# Patient Record
Sex: Male | Born: 1950 | Hispanic: No | Smoking: Never smoker
Health system: Southern US, Community
[De-identification: ages and names within clinical notes are randomized; demographics above are authoritative.]

## PROBLEM LIST (undated history)

## (undated) DIAGNOSIS — T148XXA Other injury of unspecified body region, initial encounter: Secondary | ICD-10-CM

## (undated) DIAGNOSIS — I10 Essential (primary) hypertension: Secondary | ICD-10-CM

## (undated) DIAGNOSIS — Z9049 Acquired absence of other specified parts of digestive tract: Secondary | ICD-10-CM

## (undated) HISTORY — DX: Other injury of unspecified body region, initial encounter: T14.8XXA

---

## 2000-03-12 ENCOUNTER — Ambulatory Visit (HOSPITAL_COMMUNITY): Admission: RE | Admit: 2000-03-12 | Discharge: 2000-03-12 | Payer: Self-pay | Admitting: Gastroenterology

## 2004-12-23 ENCOUNTER — Inpatient Hospital Stay (HOSPITAL_COMMUNITY): Admission: EM | Admit: 2004-12-23 | Discharge: 2004-12-27 | Payer: Self-pay | Admitting: Emergency Medicine

## 2005-04-03 ENCOUNTER — Ambulatory Visit: Payer: Self-pay | Admitting: Internal Medicine

## 2005-05-22 ENCOUNTER — Ambulatory Visit: Payer: Self-pay | Admitting: Internal Medicine

## 2005-06-06 ENCOUNTER — Ambulatory Visit: Payer: Self-pay | Admitting: Internal Medicine

## 2006-11-27 HISTORY — PX: APPENDECTOMY: SHX54

## 2011-01-30 ENCOUNTER — Encounter (INDEPENDENT_AMBULATORY_CARE_PROVIDER_SITE_OTHER): Payer: Self-pay | Admitting: *Deleted

## 2011-01-30 LAB — CONVERTED CEMR LAB
ALT: 13 units/L (ref 0–53)
AST: 15 units/L (ref 0–37)
Albumin: 4.5 g/dL (ref 3.5–5.2)
Alkaline Phosphatase: 50 units/L (ref 39–117)
BUN: 12 mg/dL (ref 6–23)
Basophils Absolute: 0 10*3/uL (ref 0.0–0.1)
Basophils Relative: 1 % (ref 0–1)
CO2: 27 meq/L (ref 19–32)
Calcium: 9.6 mg/dL (ref 8.4–10.5)
Chloride: 104 meq/L (ref 96–112)
Cholesterol: 242 mg/dL — ABNORMAL HIGH (ref 0–200)
Creatinine, Ser: 1.1 mg/dL (ref 0.40–1.50)
Eosinophils Absolute: 0.3 10*3/uL (ref 0.0–0.7)
Eosinophils Relative: 5 % (ref 0–5)
Glucose, Bld: 85 mg/dL (ref 70–99)
HCT: 45.1 % (ref 39.0–52.0)
HDL: 44 mg/dL (ref >39)
Hemoglobin: 15.8 g/dL (ref 13.0–17.0)
LDL Cholesterol: 166 mg/dL — ABNORMAL HIGH (ref 0–99)
Lymphocytes Relative: 55 % — ABNORMAL HIGH (ref 12–46)
Lymphs Abs: 3.8 10*3/uL (ref 0.7–4.0)
MCHC: 35 g/dL (ref 30.0–36.0)
MCV: 85.3 fL (ref 78.0–100.0)
Monocytes Absolute: 0.7 10*3/uL (ref 0.1–1.0)
Monocytes Relative: 10 % (ref 3–12)
Neutro Abs: 2.1 10*3/uL (ref 1.7–7.7)
Neutrophils Relative %: 30 % — ABNORMAL LOW (ref 43–77)
Platelets: 239 10*3/uL (ref 150–400)
Potassium: 4.4 meq/L (ref 3.5–5.3)
RBC: 5.29 M/uL (ref 4.22–5.81)
RDW: 13.6 % (ref 11.5–15.5)
Sodium: 142 meq/L (ref 135–145)
TSH: 3.538 microintl units/mL (ref 0.350–4.500)
Total Bilirubin: 0.6 mg/dL (ref 0.3–1.2)
Total CHOL/HDL Ratio: 5.5
Total Protein: 7.2 g/dL (ref 6.0–8.3)
Triglycerides: 159 mg/dL — ABNORMAL HIGH (ref <150)
VLDL: 32 mg/dL (ref 0–40)
WBC: 6.9 10*3/uL (ref 4.0–10.5)

## 2011-09-08 ENCOUNTER — Encounter (HOSPITAL_COMMUNITY): Payer: Self-pay | Admitting: Radiology

## 2011-09-08 ENCOUNTER — Emergency Department (HOSPITAL_COMMUNITY): Payer: 59

## 2011-09-08 ENCOUNTER — Inpatient Hospital Stay (HOSPITAL_COMMUNITY)
Admission: EM | Admit: 2011-09-08 | Discharge: 2011-09-13 | DRG: 605 | Disposition: A | Discharge status: Home / Self Care | Payer: 59 | Attending: Surgery | Admitting: Surgery

## 2011-09-08 DIAGNOSIS — D62 Acute posthemorrhagic anemia: Secondary | ICD-10-CM

## 2011-09-08 DIAGNOSIS — K560 Paralytic ileus: Secondary | ICD-10-CM | POA: Diagnosis not present

## 2011-09-08 DIAGNOSIS — S2239XA Fracture of one rib, unspecified side, initial encounter for closed fracture: Secondary | ICD-10-CM | POA: Diagnosis present

## 2011-09-08 DIAGNOSIS — E876 Hypokalemia: Secondary | ICD-10-CM | POA: Diagnosis not present

## 2011-09-08 DIAGNOSIS — S61409A Unspecified open wound of unspecified hand, initial encounter: Secondary | ICD-10-CM | POA: Diagnosis present

## 2011-09-08 DIAGNOSIS — S36113A Laceration of liver, unspecified degree, initial encounter: Secondary | ICD-10-CM | POA: Diagnosis present

## 2011-09-08 DIAGNOSIS — S36119A Unspecified injury of liver, initial encounter: Secondary | ICD-10-CM

## 2011-09-08 DIAGNOSIS — S2249XA Multiple fractures of ribs, unspecified side, initial encounter for closed fracture: Secondary | ICD-10-CM

## 2011-09-08 DIAGNOSIS — S21209A Unspecified open wound of unspecified back wall of thorax without penetration into thoracic cavity, initial encounter: Principal | ICD-10-CM | POA: Diagnosis present

## 2011-09-08 DIAGNOSIS — IMO0002 Reserved for concepts with insufficient information to code with codable children: Secondary | ICD-10-CM

## 2011-09-08 DIAGNOSIS — I10 Essential (primary) hypertension: Secondary | ICD-10-CM | POA: Diagnosis present

## 2011-09-08 DIAGNOSIS — S41109A Unspecified open wound of unspecified upper arm, initial encounter: Secondary | ICD-10-CM | POA: Diagnosis present

## 2011-09-08 HISTORY — DX: Essential (primary) hypertension: I10

## 2011-09-08 HISTORY — DX: Acquired absence of other specified parts of digestive tract: Z90.49

## 2011-09-08 LAB — COMPREHENSIVE METABOLIC PANEL
ALT: 25 U/L (ref 0–53)
AST: 25 U/L (ref 0–37)
Albumin: 2.8 g/dL — ABNORMAL LOW (ref 3.5–5.2)
Alkaline Phosphatase: 42 U/L (ref 39–117)
BUN: 20 mg/dL (ref 6–23)
CO2: 24 mEq/L (ref 19–32)
Calcium: 7.8 mg/dL — ABNORMAL LOW (ref 8.4–10.5)
Chloride: 104 mEq/L (ref 96–112)
Creatinine, Ser: 1.62 mg/dL — ABNORMAL HIGH (ref 0.50–1.35)
GFR calc Af Amer: 51 mL/min — ABNORMAL LOW (ref >90)
GFR calc non Af Amer: 44 mL/min — ABNORMAL LOW (ref >90)
Glucose, Bld: 145 mg/dL — ABNORMAL HIGH (ref 70–99)
Potassium: 3.2 meq/L — ABNORMAL LOW (ref 3.5–5.1)
Sodium: 138 mEq/L (ref 135–145)
Total Bilirubin: 0.2 mg/dL — ABNORMAL LOW (ref 0.3–1.2)
Total Protein: 5.4 g/dL — ABNORMAL LOW (ref 6.0–8.3)

## 2011-09-08 LAB — PROTIME-INR
INR: 1.12 (ref 0.00–1.49)
Prothrombin Time: 14.6 s (ref 11.6–15.2)

## 2011-09-08 LAB — CBC
HCT: 34.9 % — ABNORMAL LOW (ref 39.0–52.0)
Hemoglobin: 12.2 g/dL — ABNORMAL LOW (ref 13.0–17.0)
MCH: 29.9 pg (ref 26.0–34.0)
MCHC: 35 g/dL (ref 30.0–36.0)
MCV: 85.5 fL (ref 78.0–100.0)
Platelets: 232 10*3/uL (ref 150–400)
RBC: 4.08 MIL/uL — ABNORMAL LOW (ref 4.22–5.81)
RDW: 13.2 % (ref 11.5–15.5)
WBC: 11.6 10*3/uL — ABNORMAL HIGH (ref 4.0–10.5)

## 2011-09-08 LAB — POCT I-STAT, CHEM 8
BUN: 21 mg/dL (ref 6–23)
Calcium, Ion: 1.08 mmol/L — ABNORMAL LOW (ref 1.12–1.32)
Chloride: 104 mEq/L (ref 96–112)
Creatinine, Ser: 1.7 mg/dL — ABNORMAL HIGH (ref 0.50–1.35)
Glucose, Bld: 140 mg/dL — ABNORMAL HIGH (ref 70–99)
HCT: 36 % — ABNORMAL LOW (ref 39.0–52.0)
Hemoglobin: 12.2 g/dL — ABNORMAL LOW (ref 13.0–17.0)
Potassium: 3.3 mEq/L — ABNORMAL LOW (ref 3.5–5.1)
Sodium: 140 meq/L (ref 135–145)
TCO2: 22 mmol/L (ref 0–100)

## 2011-09-08 LAB — LACTIC ACID, PLASMA: Lactic Acid, Venous: 5.4 mmol/L — ABNORMAL HIGH (ref 0.5–2.2)

## 2011-09-08 LAB — ABO/RH: ABO/RH(D): A POS

## 2011-09-08 MED ORDER — IOHEXOL 300 MG/ML  SOLN
80.0000 mL | Freq: Once | INTRAMUSCULAR | Status: AC | PRN
  Administered 2011-09-08: 80 mL via INTRAVENOUS

## 2011-09-09 ENCOUNTER — Inpatient Hospital Stay (HOSPITAL_COMMUNITY): Payer: 59

## 2011-09-09 ENCOUNTER — Emergency Department (HOSPITAL_COMMUNITY): Payer: 59

## 2011-09-09 LAB — CBC
HCT: 28.4 % — ABNORMAL LOW (ref 39.0–52.0)
HCT: 27.2 % — ABNORMAL LOW (ref 39.0–52.0)
Hemoglobin: 9.8 g/dL — ABNORMAL LOW (ref 13.0–17.0)
Hemoglobin: 9.6 g/dL — ABNORMAL LOW (ref 13.0–17.0)
MCH: 29.4 pg (ref 26.0–34.0)
MCH: 30.1 pg (ref 26.0–34.0)
MCHC: 34.5 g/dL (ref 30.0–36.0)
MCHC: 35.3 g/dL (ref 30.0–36.0)
MCV: 85.3 fL (ref 78.0–100.0)
MCV: 85.3 fL (ref 78.0–100.0)
Platelets: 185 10*3/uL (ref 150–400)
Platelets: 206 10*3/uL (ref 150–400)
RBC: 3.33 MIL/uL — ABNORMAL LOW (ref 4.22–5.81)
RBC: 3.19 MIL/uL — ABNORMAL LOW (ref 4.22–5.81)
RDW: 13.5 % (ref 11.5–15.5)
RDW: 13.6 % (ref 11.5–15.5)
WBC: 12.5 10*3/uL — ABNORMAL HIGH (ref 4.0–10.5)
WBC: 15.8 10*3/uL — ABNORMAL HIGH (ref 4.0–10.5)

## 2011-09-09 LAB — BASIC METABOLIC PANEL
BUN: 17 mg/dL (ref 6–23)
CO2: 24 mEq/L (ref 19–32)
Calcium: 7.5 mg/dL — ABNORMAL LOW (ref 8.4–10.5)
Chloride: 108 mEq/L (ref 96–112)
Creatinine, Ser: 1.15 mg/dL (ref 0.50–1.35)
GFR calc Af Amer: 78 mL/min — ABNORMAL LOW (ref >90)
GFR calc non Af Amer: 67 mL/min — ABNORMAL LOW (ref >90)
Glucose, Bld: 131 mg/dL — ABNORMAL HIGH (ref 70–99)
Potassium: 4.1 mEq/L (ref 3.5–5.1)
Sodium: 138 mEq/L (ref 135–145)

## 2011-09-09 LAB — MRSA PCR SCREENING: MRSA by PCR: NEGATIVE

## 2011-09-10 LAB — CBC
HCT: 27.7 % — ABNORMAL LOW (ref 39.0–52.0)
HCT: 29.3 % — ABNORMAL LOW (ref 39.0–52.0)
Hemoglobin: 9.8 g/dL — ABNORMAL LOW (ref 13.0–17.0)
Hemoglobin: 10.1 g/dL — ABNORMAL LOW (ref 13.0–17.0)
MCH: 30.3 pg (ref 26.0–34.0)
MCH: 30.1 pg (ref 26.0–34.0)
MCHC: 35.4 g/dL (ref 30.0–36.0)
MCHC: 34.5 g/dL (ref 30.0–36.0)
MCV: 85.8 fL (ref 78.0–100.0)
MCV: 87.5 fL (ref 78.0–100.0)
Platelets: 204 10*3/uL (ref 150–400)
Platelets: 236 10*3/uL (ref 150–400)
RBC: 3.23 MIL/uL — ABNORMAL LOW (ref 4.22–5.81)
RBC: 3.35 MIL/uL — ABNORMAL LOW (ref 4.22–5.81)
RDW: 14 % (ref 11.5–15.5)
RDW: 14.2 % (ref 11.5–15.5)
WBC: 14.2 10*3/uL — ABNORMAL HIGH (ref 4.0–10.5)
WBC: 14.3 10*3/uL — ABNORMAL HIGH (ref 4.0–10.5)

## 2011-09-10 LAB — PROTIME-INR
INR: 1.23 (ref 0.00–1.49)
Prothrombin Time: 15.8 seconds — ABNORMAL HIGH (ref 11.6–15.2)

## 2011-09-10 LAB — COMPREHENSIVE METABOLIC PANEL
ALT: 71 U/L — ABNORMAL HIGH (ref 0–53)
AST: 62 U/L — ABNORMAL HIGH (ref 0–37)
Albumin: 2.6 g/dL — ABNORMAL LOW (ref 3.5–5.2)
Alkaline Phosphatase: 45 U/L (ref 39–117)
BUN: 14 mg/dL (ref 6–23)
CO2: 27 mEq/L (ref 19–32)
Calcium: 8.3 mg/dL — ABNORMAL LOW (ref 8.4–10.5)
Chloride: 104 mEq/L (ref 96–112)
Creatinine, Ser: 1.02 mg/dL (ref 0.50–1.35)
GFR calc Af Amer: 90 mL/min — ABNORMAL LOW (ref >90)
GFR calc non Af Amer: 77 mL/min — ABNORMAL LOW (ref >90)
Glucose, Bld: 129 mg/dL — ABNORMAL HIGH (ref 70–99)
Potassium: 3.8 mEq/L (ref 3.5–5.1)
Sodium: 137 mEq/L (ref 135–145)
Total Bilirubin: 0.6 mg/dL (ref 0.3–1.2)
Total Protein: 5.3 g/dL — ABNORMAL LOW (ref 6.0–8.3)

## 2011-09-11 LAB — CBC
HCT: 28.4 % — ABNORMAL LOW (ref 39.0–52.0)
HCT: 27.1 % — ABNORMAL LOW (ref 39.0–52.0)
Hemoglobin: 10 g/dL — ABNORMAL LOW (ref 13.0–17.0)
Hemoglobin: 9.4 g/dL — ABNORMAL LOW (ref 13.0–17.0)
MCH: 30.8 pg (ref 26.0–34.0)
MCH: 30.3 pg (ref 26.0–34.0)
MCHC: 35.2 g/dL (ref 30.0–36.0)
MCHC: 34.7 g/dL (ref 30.0–36.0)
MCV: 87.4 fL (ref 78.0–100.0)
MCV: 87.4 fL (ref 78.0–100.0)
Platelets: 208 10*3/uL (ref 150–400)
Platelets: 196 10*3/uL (ref 150–400)
RBC: 3.25 MIL/uL — ABNORMAL LOW (ref 4.22–5.81)
RBC: 3.1 MIL/uL — ABNORMAL LOW (ref 4.22–5.81)
RDW: 14.1 % (ref 11.5–15.5)
RDW: 13.9 % (ref 11.5–15.5)
WBC: 12.6 10*3/uL — ABNORMAL HIGH (ref 4.0–10.5)
WBC: 10.7 10*3/uL — ABNORMAL HIGH (ref 4.0–10.5)

## 2011-09-11 LAB — BASIC METABOLIC PANEL
BUN: 19 mg/dL (ref 6–23)
CO2: 25 mEq/L (ref 19–32)
Calcium: 8.2 mg/dL — ABNORMAL LOW (ref 8.4–10.5)
Chloride: 106 mEq/L (ref 96–112)
Creatinine, Ser: 1.03 mg/dL (ref 0.50–1.35)
GFR calc Af Amer: 89 mL/min — ABNORMAL LOW (ref >90)
GFR calc non Af Amer: 76 mL/min — ABNORMAL LOW (ref >90)
Glucose, Bld: 117 mg/dL — ABNORMAL HIGH (ref 70–99)
Potassium: 3.5 mEq/L (ref 3.5–5.1)
Sodium: 138 mEq/L (ref 135–145)

## 2011-09-12 DIAGNOSIS — K560 Paralytic ileus: Secondary | ICD-10-CM

## 2011-09-12 LAB — TYPE AND SCREEN
ABO/RH(D): A POS
Antibody Screen: NEGATIVE
Unit division: 0
Unit division: 0
Unit division: 0
Unit division: 0

## 2011-09-12 LAB — CBC
HCT: 27.3 % — ABNORMAL LOW (ref 39.0–52.0)
Hemoglobin: 9.2 g/dL — ABNORMAL LOW (ref 13.0–17.0)
MCH: 29.8 pg (ref 26.0–34.0)
MCHC: 33.7 g/dL (ref 30.0–36.0)
MCV: 88.3 fL (ref 78.0–100.0)
Platelets: 214 10*3/uL (ref 150–400)
RBC: 3.09 MIL/uL — ABNORMAL LOW (ref 4.22–5.81)
RDW: 14 % (ref 11.5–15.5)
WBC: 8.9 10*3/uL (ref 4.0–10.5)

## 2011-09-13 LAB — CBC
HCT: 28.9 % — ABNORMAL LOW (ref 39.0–52.0)
Hemoglobin: 10.1 g/dL — ABNORMAL LOW (ref 13.0–17.0)
MCH: 30.4 pg (ref 26.0–34.0)
MCHC: 34.9 g/dL (ref 30.0–36.0)
MCV: 87 fL (ref 78.0–100.0)
Platelets: 285 10*3/uL (ref 150–400)
RBC: 3.32 MIL/uL — ABNORMAL LOW (ref 4.22–5.81)
RDW: 13.7 % (ref 11.5–15.5)
WBC: 9.8 10*3/uL (ref 4.0–10.5)

## 2011-09-21 ENCOUNTER — Ambulatory Visit (INDEPENDENT_AMBULATORY_CARE_PROVIDER_SITE_OTHER): Payer: 59 | Admitting: Physician Assistant

## 2011-09-21 ENCOUNTER — Encounter (INDEPENDENT_AMBULATORY_CARE_PROVIDER_SITE_OTHER): Payer: Self-pay

## 2011-09-21 DIAGNOSIS — S41112A Laceration without foreign body of left upper arm, initial encounter: Secondary | ICD-10-CM | POA: Insufficient documentation

## 2011-09-21 DIAGNOSIS — S21422A Laceration with foreign body of left back wall of thorax with penetration into thoracic cavity, initial encounter: Secondary | ICD-10-CM

## 2011-09-21 DIAGNOSIS — E785 Hyperlipidemia, unspecified: Secondary | ICD-10-CM

## 2011-09-21 DIAGNOSIS — S41109A Unspecified open wound of unspecified upper arm, initial encounter: Secondary | ICD-10-CM

## 2011-09-21 DIAGNOSIS — S21109A Unspecified open wound of unspecified front wall of thorax without penetration into thoracic cavity, initial encounter: Secondary | ICD-10-CM

## 2011-09-21 DIAGNOSIS — S36115A Moderate laceration of liver, initial encounter: Secondary | ICD-10-CM | POA: Insufficient documentation

## 2011-09-21 DIAGNOSIS — I10 Essential (primary) hypertension: Secondary | ICD-10-CM | POA: Insufficient documentation

## 2011-09-21 MED ORDER — HYDROCODONE-ACETAMINOPHEN 10-325 MG PO TABS: 1.0000 | ORAL_TABLET | ORAL | Status: DC | PRN

## 2011-09-21 MED ORDER — HYDROCODONE-ACETAMINOPHEN 5-325 MG PO TABS: 1.0000 | ORAL_TABLET | Freq: Four times a day (QID) | ORAL | Status: AC | PRN
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
## 2011-11-29 ENCOUNTER — Emergency Department (HOSPITAL_COMMUNITY)
Admission: EM | Admit: 2011-11-29 | Discharge: 2011-11-29 | Disposition: A | Discharge status: Home / Self Care | Payer: 59 | Attending: Emergency Medicine | Admitting: Emergency Medicine

## 2011-11-29 ENCOUNTER — Emergency Department (HOSPITAL_COMMUNITY): Payer: 59

## 2011-11-29 ENCOUNTER — Encounter (HOSPITAL_COMMUNITY): Payer: Self-pay | Admitting: *Deleted

## 2011-11-29 ENCOUNTER — Other Ambulatory Visit: Payer: Self-pay

## 2011-11-29 DIAGNOSIS — I10 Essential (primary) hypertension: Secondary | ICD-10-CM | POA: Insufficient documentation

## 2011-11-29 DIAGNOSIS — R0789 Other chest pain: Secondary | ICD-10-CM

## 2011-11-29 DIAGNOSIS — R079 Chest pain, unspecified: Secondary | ICD-10-CM | POA: Insufficient documentation

## 2011-11-29 DIAGNOSIS — M940 Chondrocostal junction syndrome [Tietze]: Secondary | ICD-10-CM | POA: Insufficient documentation

## 2011-11-29 LAB — LIPASE, BLOOD: Lipase: 8 U/L — ABNORMAL LOW (ref 11–59)

## 2011-11-29 LAB — BASIC METABOLIC PANEL
BUN: 18 mg/dL (ref 6–23)
CO2: 25 mEq/L (ref 19–32)
Calcium: 9.5 mg/dL (ref 8.4–10.5)
Chloride: 104 mEq/L (ref 96–112)
Creatinine, Ser: 1.21 mg/dL (ref 0.50–1.35)
GFR calc Af Amer: 73 mL/min — ABNORMAL LOW (ref >90)
GFR calc non Af Amer: 63 mL/min — ABNORMAL LOW (ref >90)
Glucose, Bld: 85 mg/dL (ref 70–99)
Potassium: 3.5 mEq/L (ref 3.5–5.1)
Sodium: 140 mEq/L (ref 135–145)

## 2011-11-29 LAB — HEPATIC FUNCTION PANEL
ALT: 13 U/L (ref 0–53)
AST: 17 U/L (ref 0–37)
Albumin: 3.3 g/dL — ABNORMAL LOW (ref 3.5–5.2)
Alkaline Phosphatase: 59 U/L (ref 39–117)
Bilirubin, Direct: <0.1 mg/dL (ref 0.0–0.3)
Total Bilirubin: 0.3 mg/dL (ref 0.3–1.2)
Total Protein: 7.1 g/dL (ref 6.0–8.3)

## 2011-11-29 LAB — CBC
HCT: 38.9 % — ABNORMAL LOW (ref 39.0–52.0)
Hemoglobin: 13.4 g/dL (ref 13.0–17.0)
MCH: 28.9 pg (ref 26.0–34.0)
MCHC: 34.4 g/dL (ref 30.0–36.0)
MCV: 84 fL (ref 78.0–100.0)
Platelets: 235 10*3/uL (ref 150–400)
RBC: 4.63 MIL/uL (ref 4.22–5.81)
RDW: 13.1 % (ref 11.5–15.5)
WBC: 5.6 10*3/uL (ref 4.0–10.5)

## 2011-11-29 LAB — POCT I-STAT TROPONIN I: Troponin i, poc: 0 ng/mL (ref 0.00–0.08)

## 2011-11-29 LAB — CK TOTAL AND CKMB (NOT AT ARMC)
CK, MB: 2.7 ng/mL (ref 0.3–4.0)
Relative Index: INVALID (ref 0.0–2.5)
Total CK: 62 U/L (ref 7–232)

## 2011-11-29 MED ORDER — HYDROCODONE-ACETAMINOPHEN 5-325 MG PO TABS: 2.0000 | ORAL_TABLET | ORAL | Status: AC | PRN

## 2011-11-29 MED ORDER — ASPIRIN 81 MG PO CHEW
324.0000 mg | CHEWABLE_TABLET | Freq: Once | ORAL | Status: AC
  Administered 2011-11-29: 324 mg via ORAL
  Filled 2011-11-29: qty 4

## 2011-11-29 NOTE — ED Notes (Signed)
Discharge instructions reviewed with pt; verbalizes understanding.  No questions asked; no further c/o's voiced.  Pt ambulatory to lobby.  NAD noted. 

## 2011-11-29 NOTE — ED Notes (Signed)
To ed for further eval of cp and epigastric pain. Pt states when the pain started yesterday he was sitting in his chair. He noticed pain again this am when he was at work and the pain 'hit' him. Pain radiated to abd. [facility] and was then sent by pov to ed. No diaphoresis. No nausea. States he is 'sore' with palpation to chest. States his stool 'has turned white'.

## 2011-11-29 NOTE — ED Notes (Signed)
EKG completed at 11:39.

## 2011-11-29 NOTE — ED Notes (Signed)
EKG given to Dr. Adriana Simas. No OLD ekg.
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
## 2011-12-13 NOTE — Progress Notes (Signed)
This patient was not seen by me.

## 2012-06-19 ENCOUNTER — Ambulatory Visit: Payer: 59 | Admitting: Internal Medicine

## 2012-06-19 DIAGNOSIS — Z0289 Encounter for other administrative examinations: Secondary | ICD-10-CM

## 2012-08-05 ENCOUNTER — Encounter: Payer: Self-pay | Admitting: Internal Medicine

## 2012-08-05 ENCOUNTER — Ambulatory Visit (INDEPENDENT_AMBULATORY_CARE_PROVIDER_SITE_OTHER): Payer: 59 | Admitting: Internal Medicine

## 2012-08-05 ENCOUNTER — Other Ambulatory Visit (INDEPENDENT_AMBULATORY_CARE_PROVIDER_SITE_OTHER): Payer: 59

## 2012-08-05 VITALS — BP 120/80 | HR 68 | Temp 97.6°F | Resp 16 | Ht 66.0 in | Wt 170.8 lb

## 2012-08-05 DIAGNOSIS — Z0000 Encounter for general adult medical examination without abnormal findings: Secondary | ICD-10-CM | POA: Insufficient documentation

## 2012-08-05 DIAGNOSIS — I10 Essential (primary) hypertension: Secondary | ICD-10-CM

## 2012-08-05 DIAGNOSIS — Z23 Encounter for immunization: Secondary | ICD-10-CM

## 2012-08-05 DIAGNOSIS — E785 Hyperlipidemia, unspecified: Secondary | ICD-10-CM

## 2012-08-05 DIAGNOSIS — Z79899 Other long term (current) drug therapy: Secondary | ICD-10-CM

## 2012-08-05 LAB — COMPREHENSIVE METABOLIC PANEL
ALT: 16 U/L (ref 0–53)
AST: 22 U/L (ref 0–37)
Albumin: 3.9 g/dL (ref 3.5–5.2)
Alkaline Phosphatase: 49 U/L (ref 39–117)
BUN: 15 mg/dL (ref 6–23)
CO2: 28 mEq/L (ref 19–32)
Calcium: 9.6 mg/dL (ref 8.4–10.5)
Chloride: 102 mEq/L (ref 96–112)
Creatinine, Ser: 1.2 mg/dL (ref 0.4–1.5)
GFR: 64.47 mL/min (ref >60.00)
Glucose, Bld: 101 mg/dL — ABNORMAL HIGH (ref 70–99)
Potassium: 4.1 mEq/L (ref 3.5–5.1)
Sodium: 139 mEq/L (ref 135–145)
Total Bilirubin: 0.6 mg/dL (ref 0.3–1.2)
Total Protein: 7.1 g/dL (ref 6.0–8.3)

## 2012-08-05 LAB — CBC WITH DIFFERENTIAL/PLATELET
Basophils Absolute: 0.1 10*3/uL (ref 0.0–0.1)
Basophils Relative: 1 % (ref 0.0–3.0)
Eosinophils Absolute: 0.4 10*3/uL (ref 0.0–0.7)
Eosinophils Relative: 4.9 % (ref 0.0–5.0)
HCT: 46 % (ref 39.0–52.0)
Hemoglobin: 15.1 g/dL (ref 13.0–17.0)
Lymphocytes Relative: 43.9 % (ref 12.0–46.0)
Lymphs Abs: 3.2 10*3/uL (ref 0.7–4.0)
MCHC: 32.8 g/dL (ref 30.0–36.0)
MCV: 88.8 fl (ref 78.0–100.0)
Monocytes Absolute: 0.9 10*3/uL (ref 0.1–1.0)
Monocytes Relative: 11.6 % (ref 3.0–12.0)
Neutro Abs: 2.9 10*3/uL (ref 1.4–7.7)
Neutrophils Relative %: 38.6 % — ABNORMAL LOW (ref 43.0–77.0)
Platelets: 277 10*3/uL (ref 150.0–400.0)
RBC: 5.18 Mil/uL (ref 4.22–5.81)
RDW: 13.9 % (ref 11.5–14.6)
WBC: 7.4 10*3/uL (ref 4.5–10.5)

## 2012-08-05 LAB — URINALYSIS, ROUTINE W REFLEX MICROSCOPIC
Bilirubin Urine: NEGATIVE
Hgb urine dipstick: NEGATIVE
Ketones, ur: NEGATIVE
Leukocytes, UA: NEGATIVE
Nitrite: NEGATIVE
Specific Gravity, Urine: <=1.005 (ref 1.000–1.030)
Total Protein, Urine: NEGATIVE
Urine Glucose: NEGATIVE
Urobilinogen, UA: 0.2 (ref 0.0–1.0)
pH: 6 (ref 5.0–8.0)

## 2012-08-05 LAB — LIPID PANEL
Cholesterol: 240 mg/dL — ABNORMAL HIGH (ref 0–200)
HDL: 39.9 mg/dL (ref >39.00)
Total CHOL/HDL Ratio: 6
Triglycerides: 249 mg/dL — ABNORMAL HIGH (ref 0.0–149.0)
VLDL: 49.8 mg/dL — ABNORMAL HIGH (ref 0.0–40.0)

## 2012-08-05 LAB — PSA: PSA: 2.01 ng/mL (ref 0.10–4.00)

## 2012-08-05 LAB — TSH: TSH: 1.73 u[IU]/mL (ref 0.35–5.50)

## 2012-08-05 LAB — LDL CHOLESTEROL, DIRECT: Direct LDL: 155.7 mg/dL

## 2012-08-05 NOTE — Patient Instructions (Signed)

## 2012-08-05 NOTE — Progress Notes (Signed)
Subjective:    Patient ID: Edward Arias, male    DOB: 06-02-1951, 61 y.o.   MRN: [id number]  Hypertension This is a chronic problem. The current episode started more than 1 year ago. The problem has been gradually improving since onset. The problem is controlled. Associated symptoms include malaise/fatigue. Pertinent negatives include no anxiety, blurred vision, chest pain, headaches, neck pain, orthopnea, palpitations, peripheral edema, PND, shortness of breath or sweats. Past treatments include ACE inhibitors and diuretics. The current treatment provides significant improvement. Compliance problems include exercise and diet.       Review of Systems  Constitutional: Positive for malaise/fatigue and fatigue. Negative for fever, chills, diaphoresis, activity change, appetite change and unexpected weight change.  HENT: Negative.  Negative for neck pain.   Eyes: Negative.  Negative for blurred vision.  Respiratory: Negative for cough, chest tightness, shortness of breath, wheezing and stridor.   Cardiovascular: Negative for chest pain, palpitations, orthopnea, leg swelling and PND.  Gastrointestinal: Negative for nausea, vomiting, abdominal pain, diarrhea, constipation, blood in stool and anal bleeding.  Genitourinary: Negative.   Musculoskeletal: Negative for myalgias, back pain, joint swelling, arthralgias and gait problem.  Neurological: Negative for dizziness, tremors, seizures, syncope, facial asymmetry, speech difficulty, weakness, light-headedness, numbness and headaches.  Hematological: Negative for adenopathy. Does not bruise/bleed easily.  Psychiatric/Behavioral: Negative.        Objective:   Physical Exam  Vitals reviewed. Constitutional: He is oriented to person, place, and time. He appears well-developed and well-nourished. No distress.  HENT:  Head: Normocephalic and atraumatic.  Mouth/Throat: Oropharynx is clear and moist. No oropharyngeal exudate.  Eyes: Conjunctivae  normal are normal. Right eye exhibits no discharge. Left eye exhibits no discharge. No scleral icterus.  Neck: Normal range of motion. Neck supple. No JVD present. No tracheal deviation present. No thyromegaly present.  Cardiovascular: Normal rate, regular rhythm, normal heart sounds and intact distal pulses.  Exam reveals no gallop and no friction rub.   No murmur heard. Pulmonary/Chest: Effort normal and breath sounds normal. No stridor. No respiratory distress. He has no wheezes. He has no rales. He exhibits no tenderness.  Abdominal: Soft. Bowel sounds are normal. He exhibits no distension and no mass. There is no tenderness. There is no rebound and no guarding. Hernia confirmed negative in the right inguinal area and confirmed negative in the left inguinal area.  Genitourinary: Rectum normal, prostate normal, testes normal and penis normal. Rectal exam shows no external hemorrhoid, no internal hemorrhoid, no fissure, no mass, no tenderness and anal tone normal. Guaiac negative stool. Prostate is not enlarged and not tender. Right testis shows no mass, no swelling and no tenderness. Right testis is descended. Left testis shows no mass, no swelling and no tenderness. Left testis is descended. Circumcised. No penile erythema or penile tenderness. No discharge found.  Musculoskeletal: Normal range of motion. He exhibits no edema and no tenderness.  Lymphadenopathy:    He has no cervical adenopathy.       Right: No inguinal adenopathy present.       Left: No inguinal adenopathy present.  Neurological: He is oriented to person, place, and time.  Skin: Skin is warm and dry. No rash noted. He is not diaphoretic. No erythema. No pallor.  Psychiatric: He has a normal mood and affect. His behavior is normal. Judgment and thought content normal.     Lab Results  Component Value Date   WBC 5.6 11/29/2011   HGB 13.4 11/29/2011   HCT 38.9* 11/29/2011  PLT 235 11/29/2011   GLUCOSE 85 11/29/2011   CHOL 242*  01/30/2011   TRIG 159* 01/30/2011   HDL 44 01/30/2011   LDLCALC 865* 01/30/2011   ALT 13 11/29/2011   AST 17 11/29/2011   NA 140 11/29/2011   K 3.5 11/29/2011   CL 104 11/29/2011   CREATININE 1.21 11/29/2011   BUN 18 11/29/2011   CO2 25 11/29/2011   TSH 3.538 01/30/2011   INR 1.23 09/10/2011       Assessment & Plan:

## 2012-08-08 ENCOUNTER — Encounter: Payer: Self-pay | Admitting: Internal Medicine

## 2012-08-08 NOTE — Assessment & Plan Note (Signed)
Exam done, labs ordered, vaccines were reviewed, pt ed material was given 

## 2012-08-08 NOTE — Assessment & Plan Note (Signed)
His BP is well controlled, I will check his lytes and renal function 

## 2012-08-08 NOTE — Assessment & Plan Note (Signed)
I will recheck his FLP today 

## 2013-05-21 ENCOUNTER — Telehealth: Payer: Self-pay | Admitting: Internal Medicine

## 2013-05-21 NOTE — Telephone Encounter (Signed)
Patient Information:  Caller Name: Yazid  Phone: [phone]  Patient: Edward Arias  Gender: Male  DOB: 08-26-1951  Age: 62 Years  PCP: Sanda Linger (Adults only)  Office Follow Up:  Does the office need to follow up with this patient?: No  Instructions For The Office: N/A  RN Note:  Constant left sided chest  "tightness" that worsens with exertion. Pain radiates to left neck.  Symptoms present for 1 week.  Called from work. Agreed to call 911 after explained risks of delayed treatment including MI or death.    Symptoms  Reason For Call & Symptoms: Emergent call: Constant substernal chest tightness rated 4/10. present X 1 week. Chest pain radiates to left neck.  No sweating, nausea or vomiting.  Reviewed Health History In EMR: Yes  Reviewed Medications In EMR: Yes  Reviewed Allergies In EMR: Yes  Reviewed Surgeries / Procedures: Yes  Date of Onset of Symptoms: 05/14/2013  Treatments Tried: Alleve  Treatments Tried Worked: No  Guideline(s) Used:  Chest Pain  Disposition Per Guideline:   Call EMS 911 Now  Reason For Disposition Reached:   Chest pain lasting longer than 5 minutes and ANY of the following:  Over 20 years old Over 63 years old and at least one cardiac risk factor (i.e., high blood pressure, diabetes, high cholesterol, obesity, smoker or strong family history of heart disease) Pain is crushing, pressure-like, or heavy  Took nitroglycerin and chest pain was not relieved History of heart disease (i.e., angina, heart attack, bypass surgery, angioplasty, CHF)  Advice Given:  N/A  Patient Will Follow Care Advice:  YES

## 2013-05-22 ENCOUNTER — Ambulatory Visit (INDEPENDENT_AMBULATORY_CARE_PROVIDER_SITE_OTHER): Payer: BC Managed Care – PPO | Admitting: Internal Medicine

## 2013-05-22 ENCOUNTER — Ambulatory Visit (INDEPENDENT_AMBULATORY_CARE_PROVIDER_SITE_OTHER)
Admission: RE | Admit: 2013-05-22 | Discharge: 2013-05-22 | Disposition: A | Discharge status: Home / Self Care | Payer: BC Managed Care – PPO | Source: Ambulatory Visit | Attending: Internal Medicine | Admitting: Internal Medicine

## 2013-05-22 ENCOUNTER — Encounter: Payer: Self-pay | Admitting: Internal Medicine

## 2013-05-22 VITALS — BP 120/80 | HR 52 | Temp 98.1°F | Resp 16 | Wt 173.0 lb

## 2013-05-22 DIAGNOSIS — I10 Essential (primary) hypertension: Secondary | ICD-10-CM

## 2013-05-22 DIAGNOSIS — M542 Cervicalgia: Secondary | ICD-10-CM

## 2013-05-22 DIAGNOSIS — R079 Chest pain, unspecified: Secondary | ICD-10-CM

## 2013-05-22 MED ORDER — NAPROXEN-ESOMEPRAZOLE 500-20 MG PO TBEC: 1.0000 | DELAYED_RELEASE_TABLET | Freq: Two times a day (BID) | ORAL | Status: DC | PRN

## 2013-05-22 NOTE — Patient Instructions (Signed)

## 2013-05-22 NOTE — Progress Notes (Signed)
Subjective:    Patient ID: Edward Arias, male    DOB: 06/20/1951, 62 y.o.   MRN: [id number]  Chest Pain  This is a new problem. The current episode started 1 to 4 weeks ago. The onset quality is gradual. The problem occurs intermittently. The problem has been gradually improving. Pain location: right anterior chest wall area. The pain is at a severity of 1/10. The pain is mild. The quality of the pain is described as dull. The pain does not radiate. Pertinent negatives include no abdominal pain, back pain, claudication, cough, diaphoresis, dizziness, exertional chest pressure, fever, headaches, hemoptysis, irregular heartbeat, leg pain, lower extremity edema, malaise/fatigue, nausea, near-syncope, numbness, orthopnea, palpitations, PND, shortness of breath, sputum production, syncope, vomiting or weakness. The pain is aggravated by movement. He has tried NSAIDs for the symptoms. The treatment provided significant relief. Risk factors include male gender.      Review of Systems  Constitutional: Negative.  Negative for fever, chills, malaise/fatigue, diaphoresis, activity change, appetite change, fatigue and unexpected weight change.  HENT: Positive for neck pain (left lateral neck pain) and neck stiffness. Negative for facial swelling (left lateral neck pain).   Eyes: Negative.   Respiratory: Negative.  Negative for apnea, cough, hemoptysis, sputum production, choking, chest tightness, shortness of breath, wheezing and stridor.   Cardiovascular: Positive for chest pain. Negative for palpitations, orthopnea, claudication, leg swelling, syncope, PND and near-syncope.  Gastrointestinal: Negative.  Negative for nausea, vomiting and abdominal pain.  Endocrine: Negative.   Genitourinary: Negative.   Musculoskeletal: Negative for back pain.  Skin: Negative.   Allergic/Immunologic: Negative.   Neurological: Negative.  Negative for dizziness, weakness, numbness and headaches.  Hematological: Negative.   Negative for adenopathy. Does not bruise/bleed easily.  Psychiatric/Behavioral: Negative.        Objective:   Physical Exam  Vitals reviewed. Constitutional: He is oriented to person, place, and time. He appears well-developed and well-nourished. No distress.  HENT:  Head: Normocephalic and atraumatic.  Mouth/Throat: Oropharynx is clear and moist. No oropharyngeal exudate.  Eyes: Conjunctivae are normal. Right eye exhibits no discharge. Left eye exhibits no discharge. No scleral icterus.  Neck: Normal range of motion. Neck supple. No JVD present. No tracheal deviation present. No thyromegaly present.  Cardiovascular: Normal rate, regular rhythm, normal heart sounds and intact distal pulses.  Exam reveals no gallop and no friction rub.   No murmur heard. Pulmonary/Chest: Effort normal and breath sounds normal. No accessory muscle usage or stridor. Not tachypneic. No respiratory distress. He has no decreased breath sounds. He has no wheezes. He has no rhonchi. He has no rales. Chest wall is not dull to percussion. He exhibits no mass, no tenderness, no bony tenderness, no laceration, no crepitus, no edema, no deformity, no swelling and no retraction.  Abdominal: Soft. Bowel sounds are normal. He exhibits no distension and no mass. There is no tenderness. There is no rebound and no guarding.  Musculoskeletal: Normal range of motion. He exhibits no edema and no tenderness.       Cervical back: Normal. He exhibits normal range of motion, no tenderness, no bony tenderness, no swelling, no edema, no deformity, no laceration, no pain, no spasm and normal pulse.  Lymphadenopathy:    He has no cervical adenopathy.  Neurological: He is alert and oriented to person, place, and time. He has normal strength. He displays no atrophy, no tremor and normal reflexes. No cranial nerve deficit or sensory deficit. He exhibits normal muscle tone. He displays a negative  Romberg sign. He displays no seizure activity.  Coordination and gait normal.  Reflex Scores:      Tricep reflexes are 1+ on the right side and 1+ on the left side.      Bicep reflexes are 1+ on the right side and 1+ on the left side.      Brachioradialis reflexes are 1+ on the right side and 1+ on the left side.      Patellar reflexes are 1+ on the right side and 1+ on the left side.      Achilles reflexes are 1+ on the right side and 1+ on the left side. Skin: Skin is warm and dry. No rash noted. He is not diaphoretic. No erythema. No pallor.  Psychiatric: He has a normal mood and affect. His behavior is normal. Judgment and thought content normal.     Lab Results  Component Value Date   WBC 7.4 08/05/2012   HGB 15.1 08/05/2012   HCT 46.0 08/05/2012   PLT 277.0 08/05/2012   GLUCOSE 101* 08/05/2012   CHOL 240* 08/05/2012   TRIG 249.0* 08/05/2012   HDL 39.90 08/05/2012   LDLDIRECT 155.7 08/05/2012   LDLCALC 166* 01/30/2011   ALT 16 08/05/2012   AST 22 08/05/2012   NA 139 08/05/2012   K 4.1 08/05/2012   CL 102 08/05/2012   CREATININE 1.2 08/05/2012   BUN 15 08/05/2012   CO2 28 08/05/2012   TSH 1.73 08/05/2012   PSA 2.01 08/05/2012   INR 1.23 09/10/2011       Assessment & Plan:

## 2013-05-23 NOTE — Assessment & Plan Note (Signed)
The EKG shows bradycardia with no other abnormalities. His pain is c/w musculoskeletal pain, so I have have asked him to try a higher dose on nsaids

## 2013-05-23 NOTE — Assessment & Plan Note (Signed)
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

## 2014-01-02 ENCOUNTER — Emergency Department (HOSPITAL_COMMUNITY): Payer: BC Managed Care – PPO

## 2014-01-02 ENCOUNTER — Emergency Department (HOSPITAL_COMMUNITY)
Admission: EM | Admit: 2014-01-02 | Discharge: 2014-01-02 | Disposition: A | Discharge status: Home / Self Care | Payer: BC Managed Care – PPO | Attending: Emergency Medicine | Admitting: Emergency Medicine

## 2014-01-02 ENCOUNTER — Encounter (HOSPITAL_COMMUNITY): Payer: Self-pay | Admitting: Emergency Medicine

## 2014-01-02 DIAGNOSIS — Z79899 Other long term (current) drug therapy: Secondary | ICD-10-CM | POA: Insufficient documentation

## 2014-01-02 DIAGNOSIS — I10 Essential (primary) hypertension: Secondary | ICD-10-CM | POA: Insufficient documentation

## 2014-01-02 DIAGNOSIS — M5416 Radiculopathy, lumbar region: Secondary | ICD-10-CM

## 2014-01-02 DIAGNOSIS — Z7982 Long term (current) use of aspirin: Secondary | ICD-10-CM | POA: Insufficient documentation

## 2014-01-02 DIAGNOSIS — IMO0002 Reserved for concepts with insufficient information to code with codable children: Secondary | ICD-10-CM | POA: Insufficient documentation

## 2014-01-02 DIAGNOSIS — IMO0001 Reserved for inherently not codable concepts without codable children: Secondary | ICD-10-CM | POA: Insufficient documentation

## 2014-01-02 MED ORDER — MELOXICAM 15 MG PO TABS: 15.0000 mg | ORAL_TABLET | Freq: Every day | ORAL | Status: DC

## 2014-01-02 MED ORDER — OXYCODONE-ACETAMINOPHEN 5-325 MG PO TABS: 1.0000 | ORAL_TABLET | ORAL | Status: DC | PRN

## 2014-01-02 NOTE — ED Notes (Signed)
Pt c/o of left thigh pain. Denies injury. Difficulty walking x2 weeks.
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
## 2014-01-02 NOTE — Discharge Instructions (Signed)
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 I believe your thigh pain is coming from back. You will need to see an orthopedic doctor. Do not drive, operate heavy machinery, drink alcohol, or take other tylenol containing products with this medicine. Do not take any over the counter medications with these prescriptions.   Lumbosacral Radiculopathy Lumbosacral radiculopathy is a pinched nerve or nerves in the low back (lumbosacral area). When this happens you may have weakness in your legs and may not be able to stand on your toes. You may have pain going down into your legs. There may be difficulties with walking normally. There are many causes of this problem. Sometimes this may happen from an injury, or simply from arthritis or boney problems. It may also be caused by other illnesses such as diabetes. If there is no improvement after treatment, further studies may be done to find the exact cause. DIAGNOSIS  X-rays may be needed if the problems become long standing. Electromyograms may be done. This study is one in which the working of nerves and muscles is studied. HOME CARE INSTRUCTIONS   Applications of ice packs may be helpful. Ice can be used in a plastic bag with a towel around it to prevent frostbite to skin. This may be used every 2 hours for 20 to 30 minutes, or as needed, while awake, or as directed by your caregiver.  Only take over-the-counter or prescription medicines for pain, discomfort, or fever as directed by your caregiver.  If physical therapy was prescribed, follow your caregiver's directions. SEEK IMMEDIATE MEDICAL CARE IF:   You have pain not controlled with medications.  You seem to be getting worse rather than better.  You develop increasing weakness in your legs.  You develop loss of bowel or bladder control.  You have difficulty with walking or balance, or develop clumsiness in the use of your legs.  You have a fever. MAKE SURE YOU:   Understand these  instructions.  Will watch your condition.  Will get help right away if you are not doing well or get worse. Document Released: 11/13/2005 Document Revised: 02/05/2012 Document Reviewed: 07/03/2008 [facility] Patient Information 2014 [place], [place].

## 2014-01-02 NOTE — ED Notes (Signed)
Ortho tech called for crutches 

## 2014-01-02 NOTE — ED Notes (Signed)
Patient transported to X-ray 

## 2014-01-05 NOTE — ED Provider Notes (Signed)
Medical screening examination/treatment/procedure(s) were performed by non-physician practitioner and as supervising physician I was immediately available for consultation/collaboration.     Stedman [place], MD 01/05/14 2305 

## 2014-03-03 DIAGNOSIS — M109 Gout, unspecified: Secondary | ICD-10-CM | POA: Insufficient documentation

## 2014-04-23 ENCOUNTER — Emergency Department (HOSPITAL_COMMUNITY)
Admission: EM | Admit: 2014-04-23 | Discharge: 2014-04-23 | Disposition: A | Discharge status: Home / Self Care | Payer: BC Managed Care – PPO | Source: Home / Self Care | Attending: Family Medicine | Admitting: Family Medicine

## 2014-04-23 ENCOUNTER — Encounter (HOSPITAL_COMMUNITY): Payer: Self-pay | Admitting: Emergency Medicine

## 2014-04-23 DIAGNOSIS — M109 Gout, unspecified: Secondary | ICD-10-CM

## 2014-04-23 MED ORDER — OXYCODONE-ACETAMINOPHEN 5-325 MG PO TABS: 1.0000 | ORAL_TABLET | ORAL | Status: DC | PRN

## 2014-04-23 MED ORDER — COLCHICINE 0.6 MG PO TABS: ORAL_TABLET | ORAL | Status: DC

## 2014-04-23 NOTE — Discharge Instructions (Signed)
Thank you for coming in today. Follow up with your doctor.   Gout Gout is an inflammatory arthritis caused by a buildup of uric acid crystals in the joints. Uric acid is a chemical that is normally present in the blood. When the level of uric acid in the blood is too high it can form crystals that deposit in your joints and tissues. This causes joint redness, soreness, and swelling (inflammation). Repeat attacks are common. Over time, uric acid crystals can form into masses (tophi) near a joint, destroying bone and causing disfigurement. Gout is treatable and often preventable. CAUSES  The disease begins with elevated levels of uric acid in the blood. Uric acid is produced by your body when it breaks down a naturally found substance called purines. Certain foods you eat, such as meats and fish, contain high amounts of purines. Causes of an elevated uric acid level include:  Being passed down from parent to child (heredity).  Diseases that cause increased uric acid production (such as obesity, psoriasis, and certain cancers).  Excessive alcohol use.  Diet, especially diets rich in meat and seafood.  Medicines, including certain cancer-fighting medicines (chemotherapy), water pills (diuretics), and aspirin.  Chronic kidney disease. The kidneys are no longer able to remove uric acid well.  Problems with metabolism. Conditions strongly associated with gout include:  Obesity.  High blood pressure.  High cholesterol.  Diabetes. Not everyone with elevated uric acid levels gets gout. It is not understood why some people get gout and others do not. Surgery, joint injury, and eating too much of certain foods are some of the factors that can lead to gout attacks. SYMPTOMS   An attack of gout comes on quickly. It causes intense pain with redness, swelling, and warmth in a joint.  Fever can occur.  Often, only one joint is involved. Certain joints are more commonly involved:  Base of the  big toe.  Knee.  Ankle.  Wrist.  Finger. Without treatment, an attack usually goes away in a few days to weeks. Between attacks, you usually will not have symptoms, which is different from many other forms of arthritis. DIAGNOSIS  Your caregiver will suspect gout based on your symptoms and exam. In some cases, tests may be recommended. The tests may include:  Blood tests.  Urine tests.  X-rays.  Joint fluid exam. This exam requires a needle to remove fluid from the joint (arthrocentesis). Using a microscope, gout is confirmed when uric acid crystals are seen in the joint fluid. TREATMENT  There are two phases to gout treatment: treating the sudden onset (acute) attack and preventing attacks (prophylaxis).  Treatment of an Acute Attack.  Medicines are used. These include anti-inflammatory medicines or steroid medicines.  An injection of steroid medicine into the affected joint is sometimes necessary.  The painful joint is rested. Movement can worsen the arthritis.  You may use warm or cold treatments on painful joints, depending which works best for you.  Treatment to Prevent Attacks.  If you suffer from frequent gout attacks, your caregiver may advise preventive medicine. These medicines are started after the acute attack subsides. These medicines either help your kidneys eliminate uric acid from your body or decrease your uric acid production. You may need to stay on these medicines for a very long time.  The early phase of treatment with preventive medicine can be associated with an increase in acute gout attacks. For this reason, during the first few months of treatment, your caregiver may also advise  you to take medicines usually used for acute gout treatment. Be sure you understand your caregiver's directions. Your caregiver may make several adjustments to your medicine dose before these medicines are effective.  Discuss dietary treatment with your caregiver or dietitian.  Alcohol and drinks high in sugar and fructose and foods such as meat, poultry, and seafood can increase uric acid levels. Your caregiver or dietician can advise you on drinks and foods that should be limited. HOME CARE INSTRUCTIONS   Do not take aspirin to relieve pain. This raises uric acid levels.  Only take over-the-counter or prescription medicines for pain, discomfort, or fever as directed by your caregiver.  Rest the joint as much as possible. When in bed, keep sheets and blankets off painful areas.  Keep the affected joint raised (elevated).  Apply warm or cold treatments to painful joints. Use of warm or cold treatments depends on which works best for you.  Use crutches if the painful joint is in your leg.  Drink enough fluids to keep your urine clear or pale yellow. This helps your body get rid of uric acid. Limit alcohol, sugary drinks, and fructose drinks.  Follow your dietary instructions. Pay careful attention to the amount of protein you eat. Your daily diet should emphasize fruits, vegetables, whole grains, and fat-free or low-fat milk products. Discuss the use of coffee, vitamin C, and cherries with your caregiver or dietician. These may be helpful in lowering uric acid levels.  Maintain a healthy body weight. SEEK MEDICAL CARE IF:   You develop diarrhea, vomiting, or any side effects from medicines.  You do not feel better in 24 hours, or you are getting worse. SEEK IMMEDIATE MEDICAL CARE IF:   Your joint becomes suddenly more tender, and you have chills or a fever. MAKE SURE YOU:   Understand these instructions.  Will watch your condition.  Will get help right away if you are not doing well or get worse. Document Released: 11/10/2000 Document Revised: 03/10/2013 Document Reviewed: 06/26/2012 [facility] ChildrenExitCare Patient Information 2014 [place]Exit[employer], [place].  Purine Restricted Diet A low-purine diet consists of foods that reduce uric acid made in your body. INDICATIONS FOR  USE  Your caregiver may ask you to follow a low-purine diet to reduce gout flairs.  GUIDELINES  Avoid high-purine foods, including all alcohol, yeast extracts taken as supplements, and sauces made from meats (like gravy). Do not eat high-purine meats, including anchovies, sardines, herring, mussels, tuna, codfish, scallops, trout, haddock, bacon, organ meats, tripe, goose, wild game, and sweetbreads.  Grains  Allowed/Recommended: All, except those listed to consume in moderation.  Consume in Moderation: Oatmeal ( cup uncooked daily), wheat bran or germ ( cup daily), and whole grains. Vegetables  Allowed/Recommended: All, except those listed to consume in moderation.  Consume in Moderation: Asparagus, cauliflower, spinach, mushrooms, and green peas ( cup daily). Fruit  Allowed/Recommended: All.  Consume in Moderation: None. Meat and Meat Substitutes  Allowed/Recommended: Eggs, nuts, and peanut butter.  Consume in Moderation: Limit to 4 to 6 oz daily. Avoid high-purine meats. Lentils, peas, and dried beans (1 cup daily). Milk  Allowed/Recommended: All. Choose low-fat or skim when possible.  Consume in Moderation: None. Fats and Oils  Allowed/Recommended: All.  Consume in Moderation: None. Beverages  Allowed/Recommended: All, except those listed to avoid.  Avoid: All alcohol. Condiments/Miscellaneous  Allowed/Recommended: All, except those listed to consume in moderation.  Consume in Moderation: Bouillon and meat-based broths and soups. Document Released: 03/10/2011 Document Revised: 02/05/2012 Document Reviewed: 03/10/2011 ExitCare  Patient Information 2014 [place].

## 2014-04-23 NOTE — ED Notes (Signed)
See physicians note; in before Pt c/o bilateral foot/toe pain, swelling, redness onset Monday Denies inj/trauma Alert w/no signs of acute distress.
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
## 2014-09-03 DIAGNOSIS — Z5320 Procedure and treatment not carried out because of patient's decision for unspecified reasons: Secondary | ICD-10-CM | POA: Insufficient documentation

## 2014-10-27 ENCOUNTER — Encounter (HOSPITAL_COMMUNITY): Payer: Self-pay | Admitting: Emergency Medicine

## 2014-10-27 ENCOUNTER — Emergency Department (HOSPITAL_COMMUNITY)
Admission: EM | Admit: 2014-10-27 | Discharge: 2014-10-27 | Disposition: A | Discharge status: Home / Self Care | Payer: BC Managed Care – PPO | Source: Home / Self Care | Attending: Family Medicine | Admitting: Family Medicine

## 2014-10-27 DIAGNOSIS — N390 Urinary tract infection, site not specified: Secondary | ICD-10-CM

## 2014-10-27 LAB — POCT URINALYSIS DIP (DEVICE)
Bilirubin Urine: NEGATIVE
Glucose, UA: 100 mg/dL — AB
Ketones, ur: NEGATIVE mg/dL
Nitrite: POSITIVE — AB
Protein, ur: NEGATIVE mg/dL
Specific Gravity, Urine: 1.02 (ref 1.005–1.030)
Urobilinogen, UA: 2 mg/dL — ABNORMAL HIGH (ref 0.0–1.0)
pH: 6 (ref 5.0–8.0)

## 2014-10-27 MED ORDER — CEFTRIAXONE SODIUM 1 G IJ SOLR
INTRAMUSCULAR | Status: AC
  Filled 2014-10-27: qty 10

## 2014-10-27 MED ORDER — CEPHALEXIN 500 MG PO CAPS: 500.0000 mg | ORAL_CAPSULE | Freq: Four times a day (QID) | ORAL | Status: DC

## 2014-10-27 MED ORDER — LIDOCAINE HCL (PF) 1 % IJ SOLN
INTRAMUSCULAR | Status: AC
  Filled 2014-10-27: qty 5

## 2014-10-27 MED ORDER — CEFTRIAXONE SODIUM 1 G IJ SOLR
1.0000 g | Freq: Once | INTRAMUSCULAR | Status: AC
  Administered 2014-10-27: 1 g via INTRAMUSCULAR

## 2014-10-27 NOTE — ED Notes (Signed)
Pt given Rocephin injection.  Will wait 15 min to monitor the pt before d/c.  D/C at 1130 if no reaction.
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
## 2014-10-27 NOTE — ED Notes (Signed)
Pt c/o burning and odor with urination.  He denies any pain and states the color of his urine is brown.  He also states it is not an STD.

## 2014-10-27 NOTE — Discharge Instructions (Signed)
Take all of medicine as directed, drink lots of fluids, see your doctor if further problems. °

## 2014-11-18 IMAGING — CR DG LUMBAR SPINE COMPLETE 4+V
5 series · 5 of 5 positions shown · non-contrast
Comparison: None

CLINICAL DATA: Left hip and thigh pain without history of trauma

EXAM:
LUMBAR SPINE - COMPLETE 4+ VIEW

[t lumbar spine ap]
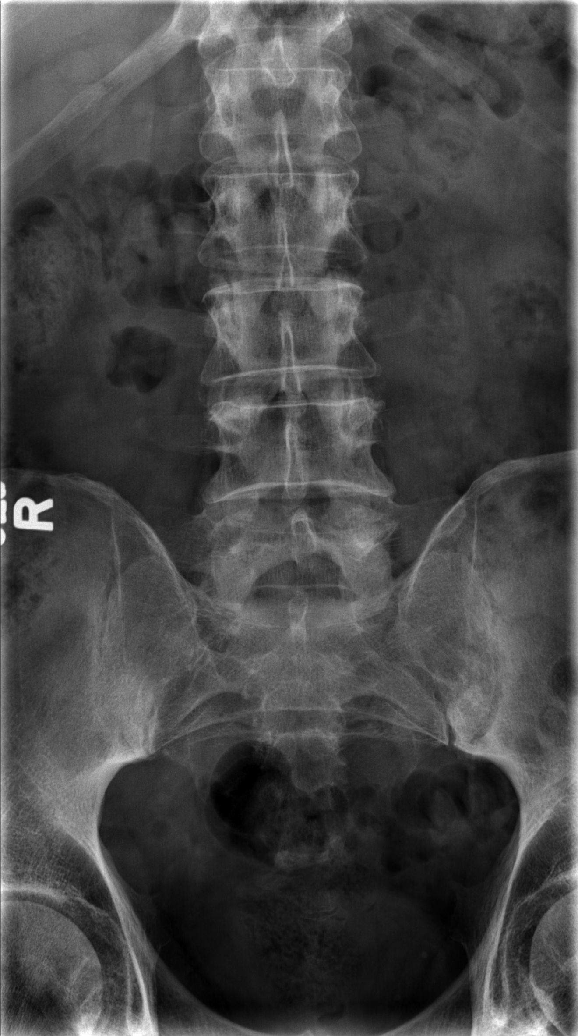

[t lumbar spine obl (1 of 2)]
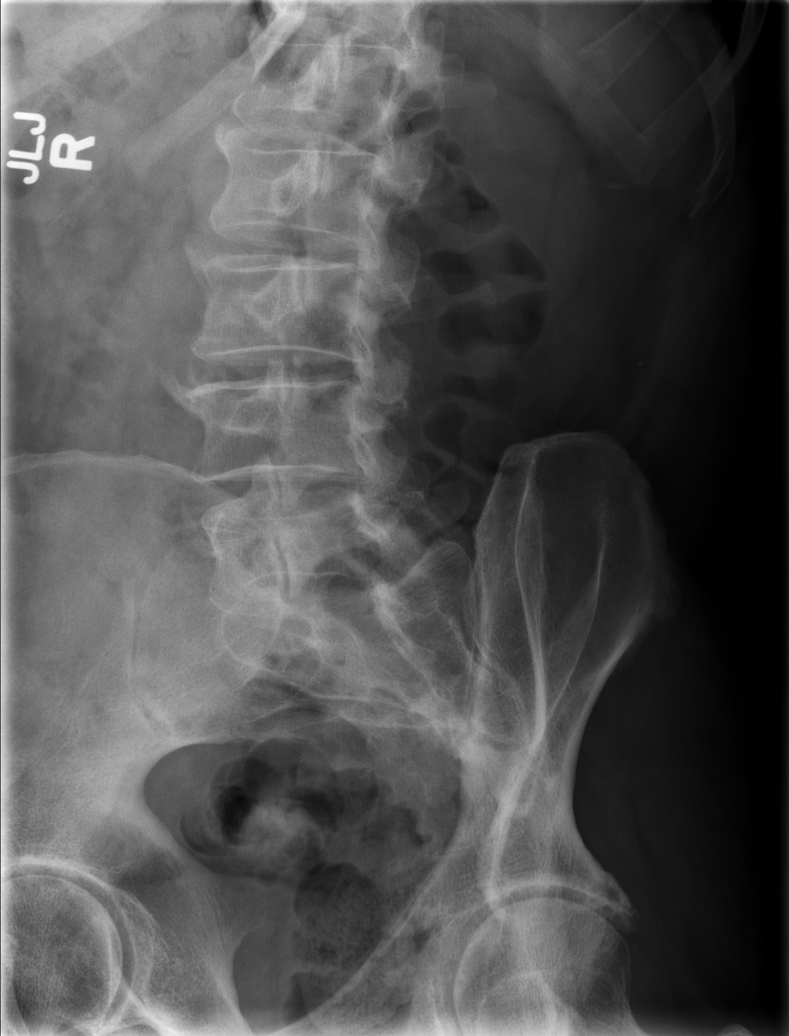

[t lumbar spine obl (2 of 2)]
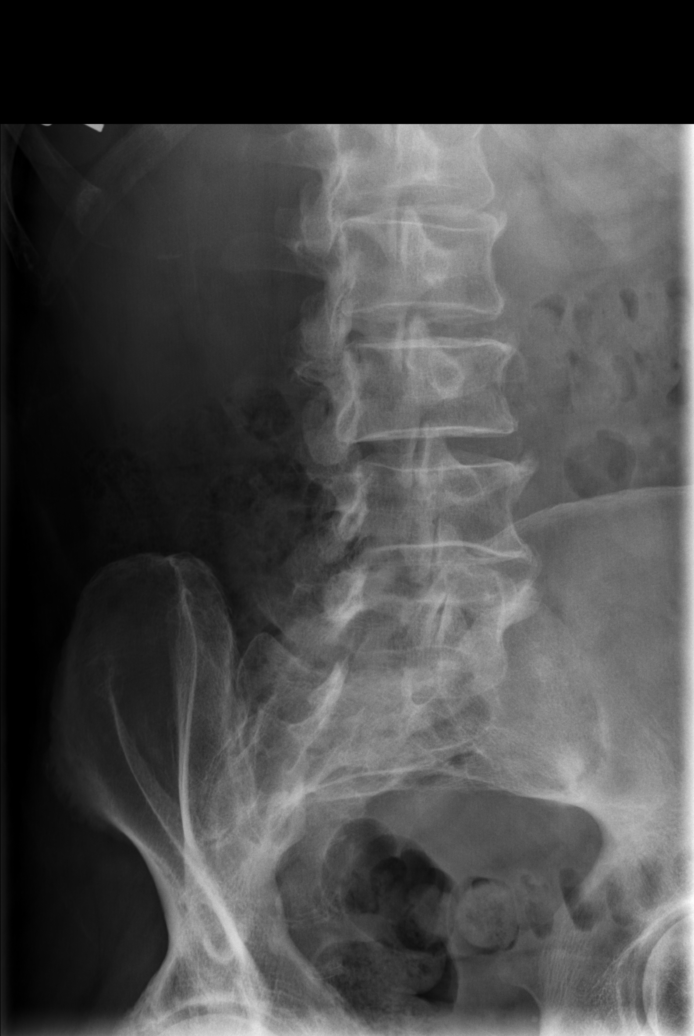

[t lumbar spine lat]
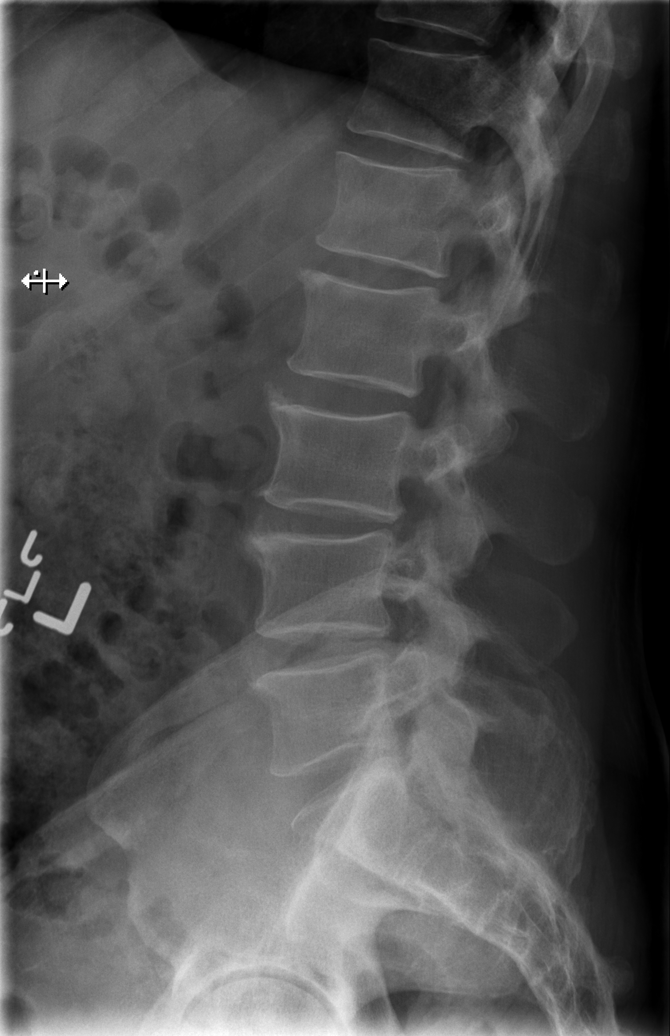

[t lumbar l-5 s-1 spot]
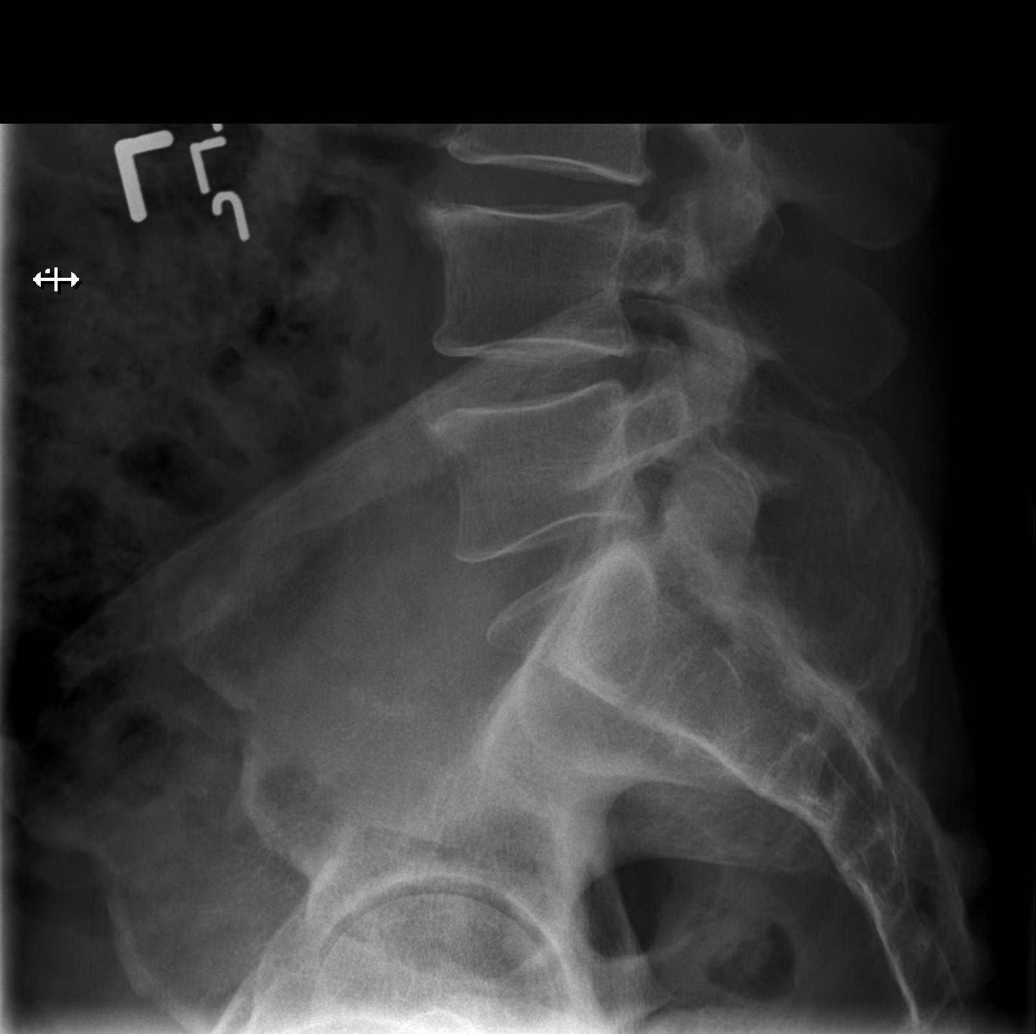

[5 of 5 positions shown; findings below may reference images not displayed]

FINDINGS: The lumbar vertebral bodies are preserved in height. The
intervertebral disc space heights are well maintained. Small
anterior endplate osteophytes are noted at L2 through L5. There is
no pars defect or spondylolisthesis. There are mild degenerative
facet joint changes of the lower lumbar spine. The pedicles and
transverse processes appear intact. The observed portions of the
sacrum are normal.
IMPRESSION: There is no acute bony abnormality of the lumbar spine. There are
mild degenerative disc and facet joint change changes in the mid and
lower lumbar spine.

## 2015-03-16 DIAGNOSIS — Z2821 Immunization not carried out because of patient refusal: Secondary | ICD-10-CM | POA: Insufficient documentation

## 2015-06-03 ENCOUNTER — Encounter: Payer: Self-pay | Admitting: Internal Medicine

## 2015-08-03 ENCOUNTER — Encounter: Payer: Self-pay | Admitting: Internal Medicine

## 2019-07-02 ENCOUNTER — Other Ambulatory Visit: Payer: Self-pay | Admitting: Family Medicine

## 2019-07-02 DIAGNOSIS — Z20822 Contact with and (suspected) exposure to covid-19: Secondary | ICD-10-CM

## 2019-07-03 ENCOUNTER — Other Ambulatory Visit: Payer: Self-pay

## 2019-07-03 DIAGNOSIS — Z20822 Contact with and (suspected) exposure to covid-19: Secondary | ICD-10-CM

## 2019-07-04 LAB — SPECIMEN STATUS REPORT

## 2019-07-04 LAB — NOVEL CORONAVIRUS, NAA: SARS-CoV-2, NAA: NOT DETECTED

## 2019-07-08 ENCOUNTER — Telehealth: Payer: Self-pay | Admitting: General Practice

## 2019-07-08 NOTE — Telephone Encounter (Signed)
Patient informed of COVID results, patient voice understanding. °

## 2019-07-16 ENCOUNTER — Other Ambulatory Visit: Payer: Self-pay

## 2019-07-16 DIAGNOSIS — Z20822 Contact with and (suspected) exposure to covid-19: Secondary | ICD-10-CM

## 2019-07-17 LAB — NOVEL CORONAVIRUS, NAA: SARS-CoV-2, NAA: NOT DETECTED

## 2019-10-13 ENCOUNTER — Inpatient Hospital Stay (HOSPITAL_COMMUNITY)
Admission: EM | Admit: 2019-10-13 | Discharge: 2019-10-15 | DRG: 247 | Disposition: A | Discharge status: Home / Self Care | Payer: 59 | Attending: Internal Medicine | Admitting: Internal Medicine

## 2019-10-13 ENCOUNTER — Inpatient Hospital Stay (HOSPITAL_COMMUNITY): Payer: 59

## 2019-10-13 ENCOUNTER — Encounter (HOSPITAL_COMMUNITY): Payer: Self-pay | Admitting: Internal Medicine

## 2019-10-13 ENCOUNTER — Ambulatory Visit (HOSPITAL_COMMUNITY): Admit: 2019-10-13 | Payer: 59 | Admitting: Internal Medicine

## 2019-10-13 ENCOUNTER — Encounter (HOSPITAL_COMMUNITY)
Admission: EM | Disposition: A | Discharge status: Home / Self Care | Payer: Self-pay | Source: Home / Self Care | Attending: Internal Medicine

## 2019-10-13 ENCOUNTER — Other Ambulatory Visit: Payer: Self-pay

## 2019-10-13 DIAGNOSIS — Z809 Family history of malignant neoplasm, unspecified: Secondary | ICD-10-CM

## 2019-10-13 DIAGNOSIS — I10 Essential (primary) hypertension: Secondary | ICD-10-CM | POA: Diagnosis present

## 2019-10-13 DIAGNOSIS — F17220 Nicotine dependence, chewing tobacco, uncomplicated: Secondary | ICD-10-CM | POA: Diagnosis present

## 2019-10-13 DIAGNOSIS — Z8249 Family history of ischemic heart disease and other diseases of the circulatory system: Secondary | ICD-10-CM

## 2019-10-13 DIAGNOSIS — I213 ST elevation (STEMI) myocardial infarction of unspecified site: Secondary | ICD-10-CM | POA: Diagnosis present

## 2019-10-13 DIAGNOSIS — Z20828 Contact with and (suspected) exposure to other viral communicable diseases: Secondary | ICD-10-CM | POA: Diagnosis present

## 2019-10-13 DIAGNOSIS — I2510 Atherosclerotic heart disease of native coronary artery without angina pectoris: Secondary | ICD-10-CM | POA: Diagnosis present

## 2019-10-13 DIAGNOSIS — E876 Hypokalemia: Secondary | ICD-10-CM | POA: Diagnosis present

## 2019-10-13 DIAGNOSIS — Z955 Presence of coronary angioplasty implant and graft: Secondary | ICD-10-CM

## 2019-10-13 DIAGNOSIS — M109 Gout, unspecified: Secondary | ICD-10-CM | POA: Diagnosis present

## 2019-10-13 DIAGNOSIS — E785 Hyperlipidemia, unspecified: Secondary | ICD-10-CM | POA: Diagnosis present

## 2019-10-13 DIAGNOSIS — I361 Nonrheumatic tricuspid (valve) insufficiency: Secondary | ICD-10-CM

## 2019-10-13 DIAGNOSIS — R0789 Other chest pain: Secondary | ICD-10-CM | POA: Diagnosis present

## 2019-10-13 DIAGNOSIS — I2121 ST elevation (STEMI) myocardial infarction involving left circumflex coronary artery: Secondary | ICD-10-CM | POA: Diagnosis present

## 2019-10-13 DIAGNOSIS — I340 Nonrheumatic mitral (valve) insufficiency: Secondary | ICD-10-CM

## 2019-10-13 DIAGNOSIS — R079 Chest pain, unspecified: Secondary | ICD-10-CM | POA: Diagnosis present

## 2019-10-13 DIAGNOSIS — I2119 ST elevation (STEMI) myocardial infarction involving other coronary artery of inferior wall: Secondary | ICD-10-CM

## 2019-10-13 DIAGNOSIS — I252 Old myocardial infarction: Secondary | ICD-10-CM | POA: Diagnosis present

## 2019-10-13 HISTORY — PX: LEFT HEART CATH AND CORONARY ANGIOGRAPHY: CATH118249

## 2019-10-13 HISTORY — PX: CORONARY/GRAFT ACUTE MI REVASCULARIZATION: CATH118305

## 2019-10-13 LAB — POCT I-STAT, CHEM 8
BUN: 24 mg/dL — ABNORMAL HIGH (ref 8–23)
Calcium, Ion: 1.18 mmol/L (ref 1.15–1.40)
Chloride: 109 mmol/L (ref 98–111)
Creatinine, Ser: 1.3 mg/dL — ABNORMAL HIGH (ref 0.61–1.24)
Glucose, Bld: 140 mg/dL — ABNORMAL HIGH (ref 70–99)
HCT: 41 % (ref 39.0–52.0)
Hemoglobin: 13.9 g/dL (ref 13.0–17.0)
Potassium: 3.2 mmol/L — ABNORMAL LOW (ref 3.5–5.1)
Sodium: 141 mmol/L (ref 135–145)
TCO2: 21 mmol/L — ABNORMAL LOW (ref 22–32)

## 2019-10-13 LAB — PROTIME-INR
INR: 1 (ref 0.8–1.2)
Prothrombin Time: 12.7 seconds (ref 11.4–15.2)

## 2019-10-13 LAB — COMPREHENSIVE METABOLIC PANEL
ALT: 22 U/L (ref 0–44)
AST: 15 U/L (ref 15–41)
Albumin: 3 g/dL — ABNORMAL LOW (ref 3.5–5.0)
Alkaline Phosphatase: 49 U/L (ref 38–126)
Anion gap: 10 (ref 5–15)
BUN: 24 mg/dL — ABNORMAL HIGH (ref 8–23)
CO2: 19 mmol/L — ABNORMAL LOW (ref 22–32)
Calcium: 8.5 mg/dL — ABNORMAL LOW (ref 8.9–10.3)
Chloride: 111 mmol/L (ref 98–111)
Creatinine, Ser: 1.47 mg/dL — ABNORMAL HIGH (ref 0.61–1.24)
GFR calc Af Amer: 56 mL/min — ABNORMAL LOW (ref >60)
GFR calc non Af Amer: 48 mL/min — ABNORMAL LOW (ref >60)
Glucose, Bld: 140 mg/dL — ABNORMAL HIGH (ref 70–99)
Potassium: 3.2 mmol/L — ABNORMAL LOW (ref 3.5–5.1)
Sodium: 140 mmol/L (ref 135–145)
Total Bilirubin: 0.4 mg/dL (ref 0.3–1.2)
Total Protein: 5.7 g/dL — ABNORMAL LOW (ref 6.5–8.1)

## 2019-10-13 LAB — CBC
HCT: 44.4 % (ref 39.0–52.0)
Hemoglobin: 15.1 g/dL (ref 13.0–17.0)
MCH: 28.9 pg (ref 26.0–34.0)
MCHC: 34 g/dL (ref 30.0–36.0)
MCV: 85.1 fL (ref 80.0–100.0)
Platelets: 347 10*3/uL (ref 150–400)
RBC: 5.22 MIL/uL (ref 4.22–5.81)
RDW: 13.4 % (ref 11.5–15.5)
WBC: 14.2 10*3/uL — ABNORMAL HIGH (ref 4.0–10.5)
nRBC: 0 % (ref 0.0–0.2)

## 2019-10-13 LAB — LIPID PANEL
Cholesterol: 202 mg/dL — ABNORMAL HIGH (ref 0–200)
HDL: 41 mg/dL (ref >40)
LDL Cholesterol: 127 mg/dL — ABNORMAL HIGH (ref 0–99)
Total CHOL/HDL Ratio: 4.9 RATIO
Triglycerides: 169 mg/dL — ABNORMAL HIGH (ref <150)
VLDL: 34 mg/dL (ref 0–40)

## 2019-10-13 LAB — APTT: aPTT: 29 seconds (ref 24–36)

## 2019-10-13 LAB — SARS CORONAVIRUS 2 BY RT PCR (HOSPITAL ORDER, PERFORMED IN ~~LOC~~ HOSPITAL LAB): SARS Coronavirus 2: NEGATIVE

## 2019-10-13 LAB — TROPONIN I (HIGH SENSITIVITY)
Troponin I (High Sensitivity): 20 ng/L — ABNORMAL HIGH (ref <18)
Troponin I (High Sensitivity): >27000 ng/L (ref <18)

## 2019-10-13 LAB — ECHOCARDIOGRAM COMPLETE
Height: 66 in
Weight: 2912 oz

## 2019-10-13 LAB — HEMOGLOBIN A1C
Hgb A1c MFr Bld: 6 % — ABNORMAL HIGH (ref 4.8–5.6)
Mean Plasma Glucose: 125.5 mg/dL

## 2019-10-13 LAB — POCT ACTIVATED CLOTTING TIME: Activated Clotting Time: 246 seconds

## 2019-10-13 SURGERY — CORONARY/GRAFT ACUTE MI REVASCULARIZATION
Anesthesia: LOCAL

## 2019-10-13 MED ORDER — VERAPAMIL HCL 2.5 MG/ML IV SOLN
INTRAVENOUS | Status: DC | PRN
  Administered 2019-10-13: 10 mL via INTRA_ARTERIAL

## 2019-10-13 MED ORDER — VERAPAMIL HCL 2.5 MG/ML IV SOLN
INTRAVENOUS | Status: AC
  Filled 2019-10-13: qty 2

## 2019-10-13 MED ORDER — ATORVASTATIN CALCIUM 80 MG PO TABS
80.0000 mg | ORAL_TABLET | Freq: Every day | ORAL | Status: DC
  Administered 2019-10-13 – 2019-10-14 (×2): 80 mg via ORAL
  Filled 2019-10-13 (×2): qty 1

## 2019-10-13 MED ORDER — FUROSEMIDE 10 MG/ML IJ SOLN
20.0000 mg | Freq: Once | INTRAMUSCULAR | Status: AC
  Administered 2019-10-13: 20 mg via INTRAVENOUS

## 2019-10-13 MED ORDER — MIDAZOLAM HCL 2 MG/2ML IJ SOLN
INTRAMUSCULAR | Status: AC
  Filled 2019-10-13: qty 2

## 2019-10-13 MED ORDER — ASPIRIN 81 MG PO CHEW
81.0000 mg | CHEWABLE_TABLET | Freq: Every day | ORAL | Status: DC
  Administered 2019-10-14 – 2019-10-15 (×2): 81 mg via ORAL
  Filled 2019-10-13 (×2): qty 1

## 2019-10-13 MED ORDER — TIROFIBAN HCL IN NACL 5-0.9 MG/100ML-% IV SOLN
INTRAVENOUS | Status: AC | PRN
  Administered 2019-10-13: 0.15 ug/kg/min via INTRAVENOUS

## 2019-10-13 MED ORDER — NITROGLYCERIN 1 MG/10 ML FOR IR/CATH LAB
INTRA_ARTERIAL | Status: AC
  Filled 2019-10-13: qty 10

## 2019-10-13 MED ORDER — IOHEXOL 350 MG/ML SOLN
INTRAVENOUS | Status: DC | PRN
  Administered 2019-10-13: 120 mL

## 2019-10-13 MED ORDER — SODIUM CHLORIDE 0.9% FLUSH: 3.0000 mL | INTRAVENOUS | Status: DC | PRN

## 2019-10-13 MED ORDER — TICAGRELOR 90 MG PO TABS
ORAL_TABLET | ORAL | Status: DC | PRN
  Administered 2019-10-13: 180 mg via ORAL

## 2019-10-13 MED ORDER — NITROGLYCERIN 1 MG/10 ML FOR IR/CATH LAB
INTRA_ARTERIAL | Status: DC | PRN
  Administered 2019-10-13 (×2): 200 ug via INTRACORONARY

## 2019-10-13 MED ORDER — ACETAMINOPHEN 325 MG PO TABS: 650.0000 mg | ORAL_TABLET | ORAL | Status: DC | PRN

## 2019-10-13 MED ORDER — ENOXAPARIN SODIUM 40 MG/0.4ML ~~LOC~~ SOLN
40.0000 mg | SUBCUTANEOUS | Status: DC
  Administered 2019-10-14: 40 mg via SUBCUTANEOUS
  Filled 2019-10-13: qty 0.4

## 2019-10-13 MED ORDER — ONDANSETRON HCL 4 MG/2ML IJ SOLN: 4.0000 mg | Freq: Four times a day (QID) | INTRAMUSCULAR | Status: DC | PRN

## 2019-10-13 MED ORDER — FENTANYL CITRATE (PF) 100 MCG/2ML IJ SOLN
INTRAMUSCULAR | Status: AC
  Filled 2019-10-13: qty 2

## 2019-10-13 MED ORDER — LABETALOL HCL 5 MG/ML IV SOLN
10.0000 mg | INTRAVENOUS | Status: AC | PRN
  Administered 2019-10-13: 13:00:00 10 mg via INTRAVENOUS
  Filled 2019-10-13: qty 4

## 2019-10-13 MED ORDER — HEPARIN (PORCINE) IN NACL 1000-0.9 UT/500ML-% IV SOLN
INTRAVENOUS | Status: DC | PRN
  Administered 2019-10-13 (×2): 500 mL

## 2019-10-13 MED ORDER — MIDAZOLAM HCL 2 MG/2ML IJ SOLN
INTRAMUSCULAR | Status: DC | PRN
  Administered 2019-10-13: 1 mg via INTRAVENOUS

## 2019-10-13 MED ORDER — LISINOPRIL 20 MG PO TABS
20.0000 mg | ORAL_TABLET | Freq: Every day | ORAL | Status: DC
  Administered 2019-10-14 – 2019-10-15 (×2): 20 mg via ORAL
  Filled 2019-10-13 (×2): qty 1

## 2019-10-13 MED ORDER — HEPARIN SODIUM (PORCINE) 1000 UNIT/ML IJ SOLN
INTRAMUSCULAR | Status: DC | PRN
  Administered 2019-10-13: 2000 [IU] via INTRAVENOUS
  Administered 2019-10-13: 8000 [IU] via INTRAVENOUS

## 2019-10-13 MED ORDER — TIROFIBAN HCL IV 12.5 MG/250 ML
0.0750 ug/kg/min | INTRAVENOUS | Status: AC
  Administered 2019-10-13: 15:00:00 0.075 ug/kg/min via INTRAVENOUS
  Filled 2019-10-13: qty 250

## 2019-10-13 MED ORDER — LIDOCAINE HCL (PF) 1 % IJ SOLN
INTRAMUSCULAR | Status: DC | PRN
  Administered 2019-10-13: 2 mL

## 2019-10-13 MED ORDER — TIROFIBAN (AGGRASTAT) BOLUS VIA INFUSION
INTRAVENOUS | Status: DC | PRN
  Administered 2019-10-13: 2065 ug via INTRAVENOUS

## 2019-10-13 MED ORDER — SODIUM CHLORIDE 0.9% FLUSH
3.0000 mL | Freq: Two times a day (BID) | INTRAVENOUS | Status: DC
  Administered 2019-10-13 – 2019-10-15 (×4): 3 mL via INTRAVENOUS

## 2019-10-13 MED ORDER — POTASSIUM CHLORIDE CRYS ER 20 MEQ PO TBCR
40.0000 meq | EXTENDED_RELEASE_TABLET | Freq: Once | ORAL | Status: AC
  Administered 2019-10-13: 40 meq via ORAL
  Filled 2019-10-13: qty 2

## 2019-10-13 MED ORDER — SODIUM CHLORIDE 0.9 % IV SOLN: 250.0000 mL | INTRAVENOUS | Status: DC | PRN

## 2019-10-13 MED ORDER — TICAGRELOR 90 MG PO TABS
90.0000 mg | ORAL_TABLET | Freq: Two times a day (BID) | ORAL | Status: DC
  Administered 2019-10-13 – 2019-10-15 (×4): 90 mg via ORAL
  Filled 2019-10-13 (×4): qty 1

## 2019-10-13 MED ORDER — FENTANYL CITRATE (PF) 100 MCG/2ML IJ SOLN
INTRAMUSCULAR | Status: DC | PRN
  Administered 2019-10-13: 25 ug via INTRAVENOUS

## 2019-10-13 MED ORDER — LIDOCAINE HCL (PF) 1 % IJ SOLN
INTRAMUSCULAR | Status: AC
  Filled 2019-10-13: qty 30

## 2019-10-13 MED ORDER — HEPARIN SODIUM (PORCINE) 1000 UNIT/ML IJ SOLN
INTRAMUSCULAR | Status: AC
  Filled 2019-10-13: qty 1

## 2019-10-13 MED ORDER — HYDRALAZINE HCL 20 MG/ML IJ SOLN
10.0000 mg | INTRAMUSCULAR | Status: AC | PRN
  Administered 2019-10-13: 10 mg via INTRAVENOUS
  Filled 2019-10-13: qty 1

## 2019-10-13 MED ORDER — FUROSEMIDE 10 MG/ML IJ SOLN
INTRAMUSCULAR | Status: AC
  Filled 2019-10-13: qty 2

## 2019-10-13 MED ORDER — HEPARIN (PORCINE) IN NACL 1000-0.9 UT/500ML-% IV SOLN
INTRAVENOUS | Status: AC
  Filled 2019-10-13: qty 1000

## 2019-10-13 MED ORDER — TIROFIBAN HCL IN NACL 5-0.9 MG/100ML-% IV SOLN
INTRAVENOUS | Status: AC
  Filled 2019-10-13: qty 100

## 2019-10-13 SURGICAL SUPPLY — 18 items
BALLN SAPPHIRE 2.5X12 (BALLOONS) ×2
BALLN SAPPHIRE ~~LOC~~ 3.25X18 (BALLOONS) ×1 IMPLANT
BALLOON SAPPHIRE 2.5X12 (BALLOONS) IMPLANT
CATH INFINITI 5 FR JL3.5 (CATHETERS) ×1 IMPLANT
CATH INFINITI 5FR ANG PIGTAIL (CATHETERS) ×1 IMPLANT
CATH LAUNCHER 6FR EBU3.5 (CATHETERS) ×1 IMPLANT
CATH LAUNCHER 6FR JR4 (CATHETERS) ×1 IMPLANT
DEVICE RAD COMP TR BAND LRG (VASCULAR PRODUCTS) ×1 IMPLANT
GLIDESHEATH SLEND SS 6F .021 (SHEATH) ×1 IMPLANT
GUIDEWIRE INQWIRE 1.5J.035X260 (WIRE) IMPLANT
INQWIRE 1.5J .035X260CM (WIRE) ×2
KIT ENCORE 26 ADVANTAGE (KITS) ×1 IMPLANT
KIT HEART LEFT (KITS) ×2 IMPLANT
PACK CARDIAC CATHETERIZATION (CUSTOM PROCEDURE TRAY) ×2 IMPLANT
STENT SYNERGY DES 3X24 (Permanent Stent) ×1 IMPLANT
TRANSDUCER W/STOPCOCK (MISCELLANEOUS) ×2 IMPLANT
TUBING CIL FLEX 10 FLL-RA (TUBING) ×2 IMPLANT
WIRE COUGAR XT STRL 190CM (WIRE) ×1 IMPLANT

## 2019-10-13 NOTE — Plan of Care (Signed)

## 2019-10-13 NOTE — Progress Notes (Signed)
Echocardiogram 2D Echocardiogram has been performed.  Oneal Deputy Brogen Duell 10/13/2019, 2:08 PM

## 2019-10-13 NOTE — ED Notes (Signed)
Pt arrived to ED for COVID swab and then straight to cath lab
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
## 2019-10-14 ENCOUNTER — Encounter (HOSPITAL_COMMUNITY): Payer: Self-pay

## 2019-10-14 DIAGNOSIS — E782 Mixed hyperlipidemia: Secondary | ICD-10-CM

## 2019-10-14 LAB — BASIC METABOLIC PANEL
Anion gap: 11 (ref 5–15)
Anion gap: 10 (ref 5–15)
BUN: 19 mg/dL (ref 8–23)
BUN: 20 mg/dL (ref 8–23)
CO2: 22 mmol/L (ref 22–32)
CO2: 22 mmol/L (ref 22–32)
Calcium: 8.7 mg/dL — ABNORMAL LOW (ref 8.9–10.3)
Calcium: 8.5 mg/dL — ABNORMAL LOW (ref 8.9–10.3)
Chloride: 107 mmol/L (ref 98–111)
Chloride: 103 mmol/L (ref 98–111)
Creatinine, Ser: 1.38 mg/dL — ABNORMAL HIGH (ref 0.61–1.24)
Creatinine, Ser: 1.46 mg/dL — ABNORMAL HIGH (ref 0.61–1.24)
GFR calc Af Amer: >60 mL/min (ref >60)
GFR calc Af Amer: 56 mL/min — ABNORMAL LOW (ref >60)
GFR calc non Af Amer: 52 mL/min — ABNORMAL LOW (ref >60)
GFR calc non Af Amer: 48 mL/min — ABNORMAL LOW (ref >60)
Glucose, Bld: 152 mg/dL — ABNORMAL HIGH (ref 70–99)
Glucose, Bld: 138 mg/dL — ABNORMAL HIGH (ref 70–99)
Potassium: 3.2 mmol/L — ABNORMAL LOW (ref 3.5–5.1)
Potassium: 3.4 mmol/L — ABNORMAL LOW (ref 3.5–5.1)
Sodium: 140 mmol/L (ref 135–145)
Sodium: 135 mmol/L (ref 135–145)

## 2019-10-14 LAB — CBC
HCT: 41.2 % (ref 39.0–52.0)
Hemoglobin: 14.5 g/dL (ref 13.0–17.0)
MCH: 29.4 pg (ref 26.0–34.0)
MCHC: 35.2 g/dL (ref 30.0–36.0)
MCV: 83.6 fL (ref 80.0–100.0)
Platelets: 313 10*3/uL (ref 150–400)
RBC: 4.93 MIL/uL (ref 4.22–5.81)
RDW: 13.5 % (ref 11.5–15.5)
WBC: 14.2 10*3/uL — ABNORMAL HIGH (ref 4.0–10.5)
nRBC: 0 % (ref 0.0–0.2)

## 2019-10-14 LAB — TROPONIN I (HIGH SENSITIVITY)
Troponin I (High Sensitivity): >27000 ng/L (ref <18)
Troponin I (High Sensitivity): >27000 ng/L (ref <18)

## 2019-10-14 LAB — MAGNESIUM: Magnesium: 1.7 mg/dL (ref 1.7–2.4)

## 2019-10-14 MED ORDER — MAGNESIUM SULFATE 2 GM/50ML IV SOLN
2.0000 g | Freq: Once | INTRAVENOUS | Status: AC
  Administered 2019-10-14: 2 g via INTRAVENOUS
  Filled 2019-10-14: qty 50

## 2019-10-14 MED ORDER — CHLORHEXIDINE GLUCONATE CLOTH 2 % EX PADS
6.0000 | MEDICATED_PAD | Freq: Every day | CUTANEOUS | Status: DC
  Administered 2019-10-14: 6 via TOPICAL

## 2019-10-14 MED ORDER — METOPROLOL TARTRATE 12.5 MG HALF TABLET
12.5000 mg | ORAL_TABLET | Freq: Two times a day (BID) | ORAL | Status: DC
  Administered 2019-10-14 – 2019-10-15 (×2): 12.5 mg via ORAL
  Filled 2019-10-14 (×3): qty 1

## 2019-10-14 MED ORDER — HYDROCHLOROTHIAZIDE 25 MG PO TABS
25.0000 mg | ORAL_TABLET | Freq: Every day | ORAL | Status: DC
  Administered 2019-10-14 – 2019-10-15 (×2): 25 mg via ORAL
  Filled 2019-10-14 (×2): qty 1

## 2019-10-14 MED ORDER — POTASSIUM CHLORIDE CRYS ER 20 MEQ PO TBCR
40.0000 meq | EXTENDED_RELEASE_TABLET | Freq: Once | ORAL | Status: AC
  Administered 2019-10-14: 40 meq via ORAL
  Filled 2019-10-14: qty 2

## 2019-10-14 NOTE — Progress Notes (Signed)
CARDIAC REHAB PHASE I   PRE:  Rate/Rhythm: 33 SB  BP:  Sitting: 131/71      SaO2: 99 RA  MODE:  Ambulation: 370 ft   POST:  Rate/Rhythm: 87 SR  BP:  Sitting: 150/58    SaO2: 98 RA  Pt ambulated 372ft in hallway independently with steady gait. Pt denies CP or SOB. Pt educated on importance of ASA, Brilinta, statin, and NTG. Pt given MI book, and stent card. Reviewed restrictions and site care. Pt referred to CRP [facility] [place]. Will f/u tomorrow and finish education.  [id number] Rufina Falco, RN BSN 10/14/2019 2:59 PM

## 2019-10-14 NOTE — Care Management (Signed)
Per [facility]/ [employer].Help Desk Co-pay amount for Brilinta 90 mg 2 a day for 30 day supply $85.00 Co-pay amount for Mail order 90 day 2 a day   $212.50.  No PA required No Deductible Tier 3 medication Retail pharmacy: [facility]  [phone]

## 2019-10-14 NOTE — Care Management (Signed)
Brilinta check sent and pending.  Tonea Leiphart RN, BSN, NCM-BC, ACM-RN [phone] 

## 2019-10-14 NOTE — Progress Notes (Addendum)
Progress Note  Patient Name: Edward Arias Date of Encounter: 10/14/2019  Primary Cardiologist: No primary care provider on file.   Subjective   Edward Arias reports that he is feeling better today. He denies any chest pain, SOB, palpitations, or headaches. He has been up and to the bathroom with no issues. He denies any history of smoking, occasional EtOH use.   Inpatient Medications    Scheduled Meds: . aspirin  81 mg Oral Daily  . atorvastatin  80 mg Oral q1800  . Chlorhexidine Gluconate Cloth  6 each Topical Daily  . enoxaparin (LOVENOX) injection  40 mg Subcutaneous Q24H  . lisinopril  20 mg Oral Daily  . sodium chloride flush  3 mL Intravenous Q12H  . ticagrelor  90 mg Oral BID   Continuous Infusions: . sodium chloride     PRN Meds: sodium chloride, acetaminophen, ondansetron (ZOFRAN) IV, sodium chloride flush   Vital Signs    Vitals:   10/14/19 0100 10/14/19 0200 10/14/19 0300 10/14/19 0400  BP: (!) 167/94 (!) 169/93  (!) 166/90  Pulse: (!) 59 (!) 58 60 (!) 57  Resp: (!) 24 (!) 22 (!) 23 16  Temp:    98.9 F (37.2 C)  TempSrc:    Oral  SpO2: 97% 98% 97% 97%    Intake/Output Summary (Last 24 hours) at 10/14/2019 0623 Last data filed at 10/14/2019 [id number] Gross per 24 hour  Intake 364.24 ml  Output 1605 ml  Net -1240.76 ml   There were no vitals filed for this visit.  Telemetry    Sinus bradycardia, occasional PVCs - Personally Reviewed  ECG    Sinus bradycardia, mild ST depression in V2 and V3 - Personally Reviewed  Physical Exam   GEN: No acute distress. Middle aged male sitting up in chair.  Neck: No JVD Cardiac: RRR, no murmurs, rubs, or gallops.  Respiratory: Clear to auscultation bilaterally. GI: Soft, nontender, non-distended  MS: Trace BL LE edema; No deformity. Right radial cath site intact.  Neuro:  Nonfocal  Psych: Normal affect   Labs    Chemistry Recent Labs  Lab 10/13/19 1049 10/13/19 1103 10/14/19 0253  NA 141 140 140   K 3.2* 3.2* 3.2*  CL 109 111 107  CO2  --  19* 22  GLUCOSE 140* 140* 152*  BUN 24* 24* 19  CREATININE 1.30* 1.47* 1.38*  CALCIUM  --  8.5* 8.7*  PROT  --  5.7*  --   ALBUMIN  --  3.0*  --   AST  --  15  --   ALT  --  22  --   ALKPHOS  --  49  --   BILITOT  --  0.4  --   GFRNONAA  --  48* 52*  GFRAA  --  56* >60  ANIONGAP  --  10 11     Hematology Recent Labs  Lab 10/13/19 1049 10/13/19 1103 10/14/19 0253  WBC  --  14.2* 14.2*  RBC  --  5.22 4.93  HGB 13.9 15.1 14.5  HCT 41.0 44.4 41.2  MCV  --  85.1 83.6  MCH  --  28.9 29.4  MCHC  --  34.0 35.2  RDW  --  13.4 13.5  PLT  --  347 313    Cardiac EnzymesNo results for input(s): TROPONINI in the last 168 hours. No results for input(s): TROPIPOC in the last 168 hours.   BNPNo results for input(s): BNP, PROBNP in the last 168 hours.  DDimer No results for input(s): DDIMER in the last 168 hours.   Radiology    No results found.  Cardiac Studies   10/13/2019 Echocardiogram IMPRESSIONS    1. Left ventricular ejection fraction, by visual estimation, is 55 to 60%. The left ventricle has normal function. There is no left ventricular hypertrophy.  2. Mid inferolateral segment, mid anterolateral segment, apical lateral segment, and mid anterior segment are abnormal.  3. Left ventricular diastolic parameters are indeterminate.  4. The left ventricle demonstrates regional wall motion abnormalities.  5. Overall average EF/strain is normal, but there are regional wall motion abnormailites in the mid to distal inferolateral wall and adjacent segments.  6. Global right ventricle has normal systolic function.The right ventricular size is normal. No increase in right ventricular wall thickness.  7. Left atrial size was mildly dilated.  8. Right atrial size was normal.  9. The mitral valve is normal in structure. Mild to moderate mitral valve regurgitation. 10. The tricuspid valve is normal in structure. Tricuspid valve  regurgitation is mild. 11. The aortic valve has an indeterminant number of cusps. Aortic valve regurgitation is trivial. No evidence of aortic valve sclerosis or stenosis. 12. The pulmonic valve was not well visualized. Pulmonic valve regurgitation is not visualized. 13. Mildly elevated pulmonary artery systolic pressure. 14. The inferior vena cava is normal in size with greater than 50% respiratory variability, suggesting right atrial pressure of 3 mmHg.   10/13/19 Cardiac catheterization: Conclusions: 1. Severe single-vessel coronary artery disease with acute plaque rupture and thrombotic occlusion of large OM1 branch (type 1 MI).  There is mild to moderate, non-obstructive coronary artery disease involving the LAD and RCA. 2. Mildly reduced left ventricular systolic function with mid/apical inferior hypokinesis.  Left ventricular filling pressure is moderately elevated. 3. Successful PCI to OM1 using Synergy 3.0 x 24 mm drug-eluting stent (postdilated to 3.4 mm) with 0% residual stenosis and TIMI-3.  Occlusion of the distal most portion of OM1 due to embolized thrombus is too small for PCI.  Recommendations: 1. Continue tirofiban infusion for 6 hours. 2. Dual antiplatelet therapy with aspirin and ticagrelor for at least 12 months. 3. Gentle diuresis; will give furosemide 20 mg IV x 1. 4. Aggressive secondary prevention  Patient Profile     68 y.o. male with a history of HTN, gout, and tobacco use (chew) who presented with chest pain and was found to have an STEMI.   Assessment & Plan    1. STEMI: Came in with chest pain, sharp in nature. EKG showed findings consistent with inferolateral STEMI. He was taken for a diagnostic cardiac catheterization. Cardiac catheterization showed severe single vessel coronary artery diease with acute plaque rupture and thrombotic occlusion of large OM1 branch, mildly reduced LV systolic function, s/p PCI with DES, occlusion of distal portion of OM1 too  small for PCI. Echocardiogram showed EF 55-60%, no LVH, moderate MR. Given 40 mg lasix. Has had an input of 400 cc, output of 1.6 L, neg 1.2L. Weight today 82. He appears euvolemic on exam today. He reports symptomatic improvement. Will need risk factor modification and to be continued on DAPT. Also will need to be on a statin, BB, and ACE-I. Patient may be stable for discharge tomorrow if he continues to improve.  -Continue ASA and ticagrelor x 12 months -Advise to discontinue tobacco use -Continue atorvastatin  -Will need repeat echocardiogram in 3 months to evaluate for resolution of MR -Transfer to telemetry floor -Cardiac rehab  2. Hypertension: He is  on lisinopril-hctz 20-25 mg daily at home. BP today 134/117-175/99, most recent was 159/90.  -Continue lisinopril 20 mg daily -Resume home HCTZ -Start metoprolol 12.5 BID, with hold parameters  3. HLD: Lipid panel yesterday showed cholesterol 202, HDL 41, and LDL 127. Given his recent STEMI will start on high intensity statin therapy. Started on atorvastain 80 mg daily.  -Continue atorvastatin 80 mg daily  4. Hypokalemia: K down to 3.2 today. Repletion as needed.  -Check mag -Repeat BMP in PM  For questions or updates, please contact [facility] Please consult [email or web address] for contact info under Cardiology/STEMI.    Signed, Claudean Severance, MD  10/14/2019, 6:23 AM    Agree with assessment and plan by Dr. Hermine Messick  68 year old gentleman admitted with inferolateral STEMI.  Postop day 1 stent placement by Dr. Okey Dupre .  He had normal LV function.  His troponins increased to greater than 27,000.  He had minimal residual disease.  He has had no recurrent symptoms.  He is ambulating without difficulty.  Does have a history of hypertension hyperlipidemia.  We will resume his home hydrochlorothiazide.  Currently on high-dose statin therapy.  His right radial puncture site appears stable.  He can be transferred to telemetry today.   Ambulate with cardiac rehab with anticipation of discharge tomorrow.  Runell Gess, M.D., FACP, [facility], [facility] [address] [place], [place]  [address]  [phone] 10/14/2019 10:45 AM

## 2019-10-15 ENCOUNTER — Telehealth: Payer: Self-pay | Admitting: Cardiovascular Disease

## 2019-10-15 LAB — BASIC METABOLIC PANEL
Anion gap: 10 (ref 5–15)
BUN: 18 mg/dL (ref 8–23)
CO2: 23 mmol/L (ref 22–32)
Calcium: 8.9 mg/dL (ref 8.9–10.3)
Chloride: 103 mmol/L (ref 98–111)
Creatinine, Ser: 1.08 mg/dL (ref 0.61–1.24)
GFR calc Af Amer: >60 mL/min (ref >60)
GFR calc non Af Amer: >60 mL/min (ref >60)
Glucose, Bld: 122 mg/dL — ABNORMAL HIGH (ref 70–99)
Potassium: 4.1 mmol/L (ref 3.5–5.1)
Sodium: 136 mmol/L (ref 135–145)

## 2019-10-15 LAB — MRSA PCR SCREENING: MRSA by PCR: NEGATIVE

## 2019-10-15 LAB — MAGNESIUM: Magnesium: 2.1 mg/dL (ref 1.7–2.4)

## 2019-10-15 MED ORDER — TICAGRELOR 90 MG PO TABS: 90.0000 mg | ORAL_TABLET | Freq: Two times a day (BID) | ORAL | 0 refills | Status: DC

## 2019-10-15 MED ORDER — METOPROLOL TARTRATE 25 MG PO TABS: 12.5000 mg | ORAL_TABLET | Freq: Two times a day (BID) | ORAL | 0 refills | Status: DC

## 2019-10-15 MED ORDER — TICAGRELOR 90 MG PO TABS: 90.0000 mg | ORAL_TABLET | Freq: Two times a day (BID) | ORAL | 3 refills | Status: DC

## 2019-10-15 MED ORDER — ATORVASTATIN CALCIUM 80 MG PO TABS: 80.0000 mg | ORAL_TABLET | Freq: Every day | ORAL | 0 refills | Status: DC

## 2019-10-15 MED ORDER — NITROGLYCERIN 0.4 MG SL SUBL: 0.4000 mg | SUBLINGUAL_TABLET | SUBLINGUAL | 3 refills | Status: AC | PRN

## 2019-10-15 MED ORDER — NITROGLYCERIN 0.4 MG SL SUBL: 0.4000 mg | SUBLINGUAL_TABLET | SUBLINGUAL | Status: DC | PRN

## 2019-10-15 NOTE — Telephone Encounter (Signed)
10/13/2019 - 10/15/2019 (2 days) [facility]

## 2019-10-15 NOTE — Progress Notes (Addendum)
Progress Note  Patient Name: Edward Arias Date of Encounter: 10/15/2019  Primary Cardiologist: No primary care provider on file.   Subjective   [name] reports that he is doing well today, he denies any chest pain, shortness of breath, lightheadedness, dizziness.  He has been up and out of bed walking around this morning as well as yesterday with no issues.  He denies any other complaints.  Inpatient Medications    Scheduled Meds: . aspirin  81 mg Oral Daily  . atorvastatin  80 mg Oral q1800  . Chlorhexidine Gluconate Cloth  6 each Topical Daily  . enoxaparin (LOVENOX) injection  40 mg Subcutaneous Q24H  . hydrochlorothiazide  25 mg Oral Daily  . lisinopril  20 mg Oral Daily  . metoprolol tartrate  12.5 mg Oral BID  . sodium chloride flush  3 mL Intravenous Q12H  . ticagrelor  90 mg Oral BID   Continuous Infusions: . sodium chloride     PRN Meds: sodium chloride, acetaminophen, ondansetron (ZOFRAN) IV, sodium chloride flush   Vital Signs    Vitals:   10/14/19 2300 10/15/19 0000 10/15/19 0100 10/15/19 0400  BP: 126/73 104/66 124/71   Pulse:      Resp: 19 16 17 15   Temp: 98.3 F (36.8 C)   98.6 F (37 C)  TempSrc: Oral   Oral  SpO2:        Intake/Output Summary (Last 24 hours) at 10/15/2019 0622 Last data filed at 10/15/2019 0455 Gross per 24 hour  Intake 932.03 ml  Output 1725 ml  Net -792.97 ml   There were no vitals filed for this visit.  Telemetry    Normal sinus rhythm, occasional PVCs- Personally Reviewed  ECG    None today  Physical Exam   GEN: No acute distress.  Middle-aged male.  Sitting up in bed. Neck: No JVD Cardiac: RRR, no murmurs, rubs, or gallops.  Respiratory: Clear to auscultation bilaterally. GI: Soft, nontender, non-distended  MS: No edema; No deformity. Neuro:  Nonfocal  Psych: Normal affect   Labs    Chemistry Recent Labs  Lab 10/13/19 1103 10/14/19 0253 10/14/19 1213  NA 140 140 135  K 3.2* 3.2* 3.4*   CL 111 107 103  CO2 19* 22 22  GLUCOSE 140* 152* 138*  BUN 24* 19 20  CREATININE 1.47* 1.38* 1.46*  CALCIUM 8.5* 8.7* 8.5*  PROT 5.7*  --   --   ALBUMIN 3.0*  --   --   AST 15  --   --   ALT 22  --   --   ALKPHOS 49  --   --   BILITOT 0.4  --   --   GFRNONAA 48* 52* 48*  GFRAA 56* >60 56*  ANIONGAP 10 11 10      Hematology Recent Labs  Lab 10/13/19 1049 10/13/19 1103 10/14/19 0253  WBC  --  14.2* 14.2*  RBC  --  5.22 4.93  HGB 13.9 15.1 14.5  HCT 41.0 44.4 41.2  MCV  --  85.1 83.6  MCH  --  28.9 29.4  MCHC  --  34.0 35.2  RDW  --  13.4 13.5  PLT  --  347 313    Cardiac EnzymesNo results for input(s): TROPONINI in the last 168 hours. No results for input(s): TROPIPOC in the last 168 hours.   BNPNo results for input(s): BNP, PROBNP in the last 168 hours.   DDimer No results for input(s): DDIMER in the last  168 hours.   Radiology    No results found.  Cardiac Studies   10/13/2019 Echocardiogram IMPRESSIONS   1. Left ventricular ejection fraction, by visual estimation, is 55 to 60%. The left ventricle has normal function. There is no left ventricular hypertrophy. 2. Mid inferolateral segment, mid anterolateral segment, apical lateral segment, and mid anterior segment are abnormal. 3. Left ventricular diastolic parameters are indeterminate. 4. The left ventricle demonstrates regional wall motion abnormalities. 5. Overall average EF/strain is normal, but there are regional wall motion abnormailites in the mid to distal inferolateral wall and adjacent segments. 6. Global right ventricle has normal systolic function.The right ventricular size is normal. No increase in right ventricular wall thickness. 7. Left atrial size was mildly dilated. 8. Right atrial size was normal. 9. The mitral valve is normal in structure. Mild to moderate mitral valve regurgitation. 10. The tricuspid valve is normal in structure. Tricuspid valve regurgitation is mild. 11. The  aortic valve has an indeterminant number of cusps. Aortic valve regurgitation is trivial. No evidence of aortic valve sclerosis or stenosis. 12. The pulmonic valve was not well visualized. Pulmonic valve regurgitation is not visualized. 13. Mildly elevated pulmonary artery systolic pressure. 14. The inferior vena cava is normal in size with greater than 50% respiratory variability, suggesting right atrial pressure of 3 mmHg.   10/13/19 Cardiac catheterization: Conclusions: 1. Severe single-vessel coronary artery disease with acute plaque rupture and thrombotic occlusion of large OM1 branch (type 1 MI). There is mild to moderate, non-obstructive coronary artery disease involving the LAD and RCA. 2. Mildly reduced left ventricular systolic function with mid/apical inferior hypokinesis. Left ventricular filling pressure is moderately elevated. 3. Successful PCI to OM1 using Synergy 3.0 x 24 mm drug-eluting stent (postdilated to 3.4 mm) with 0% residual stenosis and TIMI-3. Occlusion of the distal most portion of OM1 due to embolized thrombus is too small for PCI.  Recommendations: 1. Continue tirofiban infusion for 6 hours. 2. Dual antiplatelet therapy with aspirin and ticagrelor for at least 12 months. 3. Gentle diuresis; will give furosemide 20 mg IV x 1. 4. Aggressive secondary prevention  Patient Profile     68 y.o. male with a history of HTN, gout, and tobacco use (chew) who presented with chest pain and was found to have an STEMI.    Assessment & Plan    1. STEMI: Came in with typical chest pain. EKG showed findings consistent with inferolateral STEMI. Cardiac catheterization showed severe single vessel coronary artery diease with acute plaque rupture and thrombotic occlusion of large OM1 branch, mildly reduced LV systolic function, s/p PCI with DES, occlusion of distal portion of OM1 too small for PCI. Echocardiogram showed EF 55-60%, no LVH, moderate MR.  Today he denies any  chest pain, shortness of breath, or other symptoms.  Will need risk factor modification and to be continued on DAPT. Also will need to be on a statin, BB, and ACE-I.  Patient is stable for discharge today. -Continue ASA and ticagrelor x 12 months -Advise to discontinue tobacco use -Continue atorvastatin  -Will need repeat echocardiogram in 3 months to evaluate for resolution of MR -Cardiac rehab -Follow-up with cardiology in 2 to 3 weeks  2. Hypertension: He is on lisinopril-hctz 20-25 mg daily at home. BP today 104/62-161/80, most recent was 124/71. -Continue lisinopril 20 mg daily -Continue home HCTZ -Continue metoprolol 12.5 BID  3. HLD: Lipid panel yesterday showed cholesterol 202, HDL 41, and LDL 127. Given his recent STEMI will start on high  intensity statin therapy. Started on atorvastain 80 mg daily.  -Continue atorvastatin 80 mg daily  4. Hypokalemia: K today 4.1. Repletion as needed. Mag 2.1 today.  -Repeat BMP on follow-up  For questions or updates, please contact [place] Please consult [email or web address] for contact info under Cardiology/STEMI.    Signed, Asencion Noble, MD  10/15/2019, 6:22 AM    Agree with assessment and plan by Dr. Lonia Skinner  Mr. Oregel is postop day 2 inferolateral STEMI treated with PCI and drug-eluting stenting to his circumflex obtuse marginal branch.  He had a subbranch occlusion as well.  He had no other significant CAD.  He had normal LV function.  He remained pain-free.  His right radial puncture site is stable.  He is on a proper medications including DAPT.  His statin has been adjusted.  His exam is benign.  He is stable for discharge home today.  He did have moderate MR on 2D echo which will need to be followed by 2D echocardiogram in 3 months.  I will see him back in 2 to 3 weeks as an outpatient.  Lorretta Harp, M.D., [place], [facility], [place] [address] [place],  [place]  [address]  [phone] 10/15/2019 1:42 PM

## 2019-10-15 NOTE — Progress Notes (Signed)
CARDIAC REHAB PHASE I   Offered to walk with pt, pt states he has been walking independently this morning without any difficulty. Pt given heart healthy and diabetic diets. Reviewed exercise guidelines. Stressed importance of ASA and Brilinta. Will refer to CRP II [place], pt only interested in virtual cardiac rehab. Pt is interested in participating in Virtual Cardiac and Pulmonary Rehab. Pt advised that Virtual Cardiac and Pulmonary Rehab is provided at no cost to the patient.  Checklist:  1. Pt has smart device  ie smartphone and/or ipad for downloading an app  Yes 2. Reliable internet/wifi service    Yes 3. Understands how to use their smartphone and navigate within an app.  Yes  Pt verbalized understanding and is in agreement.   [id number] Rufina Falco, RN BSN 10/15/2019 11:19 AM

## 2019-10-15 NOTE — Plan of Care (Signed)

## 2019-10-15 NOTE — Progress Notes (Signed)
DC orders received. Pt stable with no s/s of distress. Medication and discharge info reviewed with patient. Patient DC home. Edward Arias, [name]

## 2019-10-15 NOTE — Discharge Summary (Addendum)
Discharge Summary    Patient ID: Edward Arias MRN: [id number]; DOB: 08-09-1951  Admit date: 10/13/2019 Discharge date: 10/15/2019  Primary Care Provider: No primary care provider on file.  Primary Cardiologist: Edward BattyJonathan Chekesha Behlke, MD  Primary Electrophysiologist:  None   Discharge Diagnoses    Principal Problem:   STEMI involving left circumflex coronary artery [facility](HCC) Active Problems:   Hyperlipidemia   Essential hypertension, benign   Chest pain   STEMI (ST elevation myocardial infarction) [facility](HCC)    Diagnostic Studies/Procedures    10/13/2019 Echocardiogram  1. Left ventricular ejection fraction, by visual estimation, is 55 to 60%. The left ventricle has normal function. There is no left ventricular hypertrophy. 2. Mid inferolateral segment, mid anterolateral segment, apical lateral segment, and mid anterior segment are abnormal. 3. Left ventricular diastolic parameters are indeterminate. 4. The left ventricle demonstrates regional wall motion abnormalities. 5. Overall average EF/strain is normal, but there are regional wall motion abnormailites in the mid to distal inferolateral wall and adjacent segments. 6. Global right ventricle has normal systolic function.The right ventricular size is normal. No increase in right ventricular wall thickness. 7. Left atrial size was mildly dilated. 8. Right atrial size was normal. 9. The mitral valve is normal in structure. Mild to moderate mitral valve regurgitation. 10. The tricuspid valve is normal in structure. Tricuspid valve regurgitation is mild. 11. The aortic valve has an indeterminant number of cusps. Aortic valve regurgitation is trivial. No evidence of aortic valve sclerosis or stenosis. 12. The pulmonic valve was not well visualized. Pulmonic valve regurgitation is not visualized. 13. Mildly elevated pulmonary artery systolic pressure. 14. The inferior vena cava is normal in size with greater than 50% respiratory  variability, suggesting right atrial pressure of 3 mmHg.   10/13/19 Cardiac catheterization: Conclusions: 1. Severe single-vessel coronary artery disease with acute plaque rupture and thrombotic occlusion of large OM1 branch (type 1 MI). There is mild to moderate, non-obstructive coronary artery disease involving the LAD and RCA. 2. Mildly reduced left ventricular systolic function with mid/apical inferior hypokinesis. Left ventricular filling pressure is moderately elevated. 3. Successful PCI to OM1 using Synergy 3.0 x 24 mm drug-eluting stent (postdilated to 3.4 mm) with 0% residual stenosis and TIMI-3. Occlusion of the distal most portion of OM1 due to embolized thrombus is too small for PCI.  Recommendations: 1. Continue tirofiban infusion for 6 hours. 2. Dual antiplatelet therapy with aspirin and ticagrelor for at least 12 months. 3. Gentle diuresis; will give furosemide 20 mg IV x 1. 4. Aggressive secondary prevention _____________   History of Present Illness     Edward Arias is a 68 y.o. male with history of hypertension, gout, and smokeless tobacco use, presenting via EMS with chest pain and ST elevation on EKG.   Mr. Edward Arias has noted intermittent sharp chest discomfort over the last few weeks.  This morning (10/13/19), he was at work and had sudden onset of severe (10/10) sharp substernal chest pain without radiation.  There have been no associated symptoms.  He had transient improvement in the pain though it suddenly recurred, prompting him to call 911.  When EMS arrived, he was noted to have inferolateral ST segment elevation.  In spite of receiving aspirin and nitroglycerin, chest pain continues to be 10/10.  He denies fevers, chills, and sick contacts.  He has not had any bleeding.  He denies history of stroke.  Hospital Course     Consultants: none  Inferolateral STEMI Patient was taken emergently to the cath lab for  angiography and revascularization. Heart cath  revealed single vessel  Coronary artery disease with acute plaque rupture and thrombotic occlusion of OM1 (Type 1 MI). DES to OM1. Distal occlusion of OM1 due to embolized thrombus was too small for PCI. LV filling pressures moderately elevated.  Echocardiogram revealed normal EF but regional wall motion abnormailites in the mid to distal inferolateral wall and adjacent segments. He tolerated the procedure well. He will discharge on ASA and brilinta x 12 months, BB, and statin. Marland Kitchen   Hypertension Resume home HCTZ and lisinopril. Pressures are well-controlled.   Hyperlipidemia 10/13/2019: Cholesterol 202; HDL 41; LDL Cholesterol 127; Triglycerides 169; VLDL 34 Lipitor 80 mg. Repeat lipids in 6 weeks.   Pt seen and examined by Dr. Allyson Arias and felt stable for discharge. Follow up has been made.    Did the patient have an acute coronary syndrome (MI, NSTEMI, STEMI, etc) this admission?:  Yes                               AHA/ACC Clinical Performance & Quality Measures: 1. Aspirin prescribed? - Yes 2. ADP Receptor Inhibitor (Plavix/Clopidogrel, Brilinta/Ticagrelor or Effient/Prasugrel) prescribed (includes medically managed patients)? - Yes 3. Beta Blocker prescribed? - Yes 4. High Intensity Statin (Lipitor 40-80mg  or Crestor 20-40mg ) prescribed? - Yes 5. EF assessed during THIS hospitalization? - Yes 6. For EF <40%, was ACEI/ARB prescribed? - Yes 7. For EF <40%, Aldosterone Antagonist (Spironolactone or Eplerenone) prescribed? - Not Applicable (EF >/= 40%) 8. Cardiac Rehab Phase II ordered (Included Medically managed Patients)? - Yes   _____________  Discharge Vitals Blood pressure (!) 128/57, pulse (!) 52, temperature 98.5 F (36.9 C), temperature source Oral, resp. rate (!) 22, SpO2 99 %.  There were no vitals filed for this visit.  Labs & Radiologic Studies    CBC Recent Labs    10/13/19 1103 10/14/19 0253  WBC 14.2* 14.2*  HGB 15.1 14.5  HCT 44.4 41.2  MCV 85.1 83.6  PLT 347  313   Basic Metabolic Panel Recent Labs    16/10/96 0629 10/14/19 1213 10/15/19 0249  NA  --  135 136  K  --  3.4* 4.1  CL  --  103 103  CO2  --  22 23  GLUCOSE  --  138* 122*  BUN  --  20 18  CREATININE  --  1.46* 1.08  CALCIUM  --  8.5* 8.9  MG 1.7  --  2.1   Liver Function Tests Recent Labs    10/13/19 1103  AST 15  ALT 22  ALKPHOS 49  BILITOT 0.4  PROT 5.7*  ALBUMIN 3.0*   No results for input(s): LIPASE, AMYLASE in the last 72 hours. High Sensitivity Troponin:   Recent Labs  Lab 10/13/19 1103 10/13/19 1714 10/14/19 0007 10/14/19 0629  TROPONINIHS 20* >27,000* >27,000* >27,000*    BNP Invalid input(s): POCBNP D-Dimer No results for input(s): DDIMER in the last 72 hours. Hemoglobin A1C Recent Labs    10/13/19 1103  HGBA1C 6.0*   Fasting Lipid Panel Recent Labs    10/13/19 1103  CHOL 202*  HDL 41  LDLCALC 127*  TRIG 169*  CHOLHDL 4.9   Thyroid Function Tests No results for input(s): TSH, T4TOTAL, T3FREE, THYROIDAB in the last 72 hours.  Invalid input(s): FREET3 _____________  No results found. Disposition   Pt is being discharged home today in good condition.  Follow-up Plans & Appointments  Follow-up Information    Erlene Quan, PA-C Follow up on 10/29/2019.   Specialties: Cardiology, Radiology Why: 8:30 am for [facility] Contact information: [address] [place] [address] [phone]        Lorretta Harp, MD Follow up on 01/20/2020.   Specialties: Cardiology, Radiology Why: 10:15 am Contact information: [address] [place] [address] [phone]          Discharge Instructions    Amb Referral to Cardiac Rehabilitation   Complete by: As directed    Diagnosis:  Coronary Stents STEMI     After initial evaluation and assessments completed: Virtual Based Care may be provided alone or in conjunction with Phase 2 Cardiac Rehab based on patient barriers.: Yes   Call MD for:   difficulty breathing, headache or visual disturbances   Complete by: As directed    Call MD for:  extreme fatigue   Complete by: As directed    Call MD for:  hives   Complete by: As directed    Call MD for:  persistant dizziness or light-headedness   Complete by: As directed    Call MD for:  persistant nausea and vomiting   Complete by: As directed    Call MD for:  redness, tenderness, or signs of infection (pain, swelling, redness, odor or green/yellow discharge around incision site)   Complete by: As directed    Call MD for:  severe uncontrolled pain   Complete by: As directed    Call MD for:  temperature >100.4   Complete by: As directed    Diet - low sodium heart healthy   Complete by: As directed    Diet - low sodium heart healthy   Complete by: As directed    Discharge instructions   Complete by: As directed    No driving for 1 week. No lifting over 5 lbs for 1 week. No sexual activity for 1 week. Keep procedure site clean & dry. If you notice increased pain, swelling, bleeding or pus, call/return!  You may shower, but no soaking baths/hot tubs/pools for 1 week.   Increase activity slowly   Complete by: As directed    Increase activity slowly   Complete by: As directed       Discharge Medications   Allergies as of 10/15/2019   No Known Allergies     Medication List    STOP taking these medications   CENTRUM SILVER ADULT 85+ PO   GARLIC PO   OVER THE COUNTER MEDICATION     TAKE these medications   aspirin EC 81 MG tablet Take 81 mg by mouth daily.   atorvastatin 80 MG tablet Commonly known as: LIPITOR Take 1 tablet (80 mg total) by mouth daily at 6 PM.   lisinopril-hydrochlorothiazide 20-25 MG tablet Commonly known as: ZESTORETIC Take 1 tablet by mouth daily.   metoprolol tartrate 25 MG tablet Commonly known as: LOPRESSOR Take 0.5 tablets (12.5 mg total) by mouth 2 (two) times daily.   nitroGLYCERIN 0.4 MG SL tablet Commonly known as: NITROSTAT  Place 1 tablet (0.4 mg total) under the tongue every 5 (five) minutes as needed for chest pain.   omega-3 acid ethyl esters 1 g capsule Commonly known as: LOVAZA Take 1 g by mouth daily.   ticagrelor 90 MG Tabs tablet Commonly known as: BRILINTA Take 1 tablet (90 mg total) by mouth 2 (two) times daily.   VITAMIN B-12 PO Take 1 tablet by mouth daily.  Outstanding Labs/Studies   Fasting lipids in 6 weeks  Duration of Discharge Encounter   Greater than 30 minutes including physician time.  Signed, Roe Rutherford Duke, PA 10/15/2019, 11:55 AM    Agree with note by Micah Flesher PA-C  Mr. Malay is postop day 2 inferolateral STEMI treated with PCI and drug-eluting stenting of the obtuse marginal branch.  His other vessels were free of significant disease.  LV function was normal.  He is on appropriate medications including dual antiplatelet therapy.  His risk factors have been addressed.  His right radial puncture site is stable.  His exam is benign.  Is been pain-free since intervention.  He lives in [place] city.  I will see him back in follow-up in 2 to 3 weeks.  Runell Gess, M.D., FACP, [facility], [facility] [address] [place], [place]  [address]  [phone] 10/15/2019 12:02 PM

## 2019-10-15 NOTE — Discharge Instructions (Signed)
Edward Arias,   It has been a pleasure working with you and we are glad you're feeling better. You were hospitalized for a heart attack. We placed a stent in the blood vessels in your heart to help with the blood flow. You will need to start new medications.    Follow up with your primary care provider in 1-2 weeks Please follow up with cardiology in 2-3 weeks  If your symptoms worsen or you develop new symptoms, please seek medical help whether it is your primary care provider or emergency department.  Cardiac Rehabilitation What is cardiac rehabilitation? Cardiac rehabilitation is a treatment program that helps improve the health and well-being of people who have heart problems. Cardiac rehabilitation includes exercise training, education, and counseling to help you get stronger and return to an active lifestyle. This program can help you get better faster and reduce any future hospital stays. Why might I need cardiac rehabilitation? Cardiac rehabilitation programs can help when you have or have had:  A heart attack.  Heart failure.  Peripheral artery disease.  Coronary artery disease.  Angina.  Lung or breathing problems. Cardiac rehabilitation programs are also used when you have had:  Coronary artery bypass graft surgery.  Heart valve replacement.  Heart stent placement.  Heart transplant.  Aneurysm repair. What are the benefits of cardiac rehabilitation? Cardiac rehabilitation can help you:  Reduce problems like chest pain and trouble breathing.  Change risk factors that contribute to heart disease, such as: ? Smoking. ? High blood pressure. ? High cholesterol. ? Diabetes. ? Being inactive. ? Weighing over 30% more than your ideal weight. ? Diet.  Improve your emotional outlook so you feel: ? More hopeful. ? Better about yourself. ? More confident about taking care of yourself.  Get support from health experts as well as other people with similar  problems.  Learn healthy ways to manage stress.  Learn how to manage and understand your medicines.  Teach your family about your condition and how to participate in your recovery. What happens in cardiac rehabilitation? You will be assessed by a cardiac rehabilitation team. They will check your health history and do a physical exam. You may need blood tests, exercise stress tests, and other evaluations to make sure that you are ready to start cardiac rehabilitation. The cardiac rehabilitation team works with you to make a plan based on your health and goals. Your program will be tailored to fit you and your needs and may change as you progress. You may work with a health care team that includes:  Doctors.  Nurses.  Dietitians.  Psychologists.  Exercise specialists.  Physical and occupational therapists. What are the phases of cardiac rehabilitation? A cardiac rehabilitation program is often divided into phases. You advance from one phase to the next. Phase 1 This phase starts while you are still in the hospital. You may:  Start by walking in your room and then in the hall.  Do some simple exercises with a therapist.  Phase 2 This phase begins when you go home or to another facility. You will travel to a cardiac rehabilitation center or another place where rehabilitation is offered. This phase may last 8-12 weeks. During this phase:  You will slowly increase your activity level while being closely watched by a nurse or therapist.  You will have medical tests and exams to monitor your progress.  Your exercises may include strength or resistance training along with activities that cause your heart to beat faster (aerobic  exercises), such as walking on a treadmill.  Your condition will determine how often and how long these sessions last.  You may learn how to: ? Adriana Simas heart-healthy meals. ? Control your blood sugar, if this applies. ? Stop smoking. ? Manage your medicines.  You may need help with scheduling or planning how and when to take your medicines. If you have questions about your medicines, it is very important that you talk with your health care provider.  Phase 3 This phase continues for the rest of your life. In this phase:  There will be less supervision.  You may continue to participate in cardiac rehabilitation activities or become part of a group in your community.  You may benefit from talking about your experience with other people who are facing similar challenges. Follow these instructions at home:  Take over-the-counter and prescription medicines only as told by your health care provider.  Keep all follow-up visits as told by your health care provider. This is important. Get help right away if:  You have severe chest discomfort, especially if the pain is crushing or pressure-like and spreads to your arms, back, neck, or jaw. Do not wait to see if the pain will go away.  You have weakness or numbness in your face, arms, or legs, especially on one side of the body.  Your speech is slurred.  You are confused.  You have a sudden, severe headache or loss of vision.  You have shortness of breath.  You are sweating and have nausea.  You feel dizzy or faint.  You are fatigued. These symptoms may represent a serious problem that is an emergency. Do not wait to see if the symptoms will go away. Get medical help right away. Call your local emergency services (911 in the [place] Do not drive yourself to the hospital. Summary  Cardiac rehabilitation is a treatment program that helps improve the health and well-being of people who have heart problems.  A cardiac rehabilitation program is often divided into phases. You advance from one phase to the next.  The cardiac rehabilitation team works with you to make a plan based on your health and goals.  Cardiac rehabilitation includes exercise training, education, and counseling to help you get  stronger and return to an active lifestyle. This information is not intended to replace advice given to you by your health care provider. Make sure you discuss any questions you have with your health care provider. Document Released: 08/22/2008 Document Revised: 03/05/2019 Document Reviewed: 09/12/2018 Elsevier Patient Education  2020 [employer].

## 2019-10-15 NOTE — Telephone Encounter (Signed)
Patient has TOC scheduled for 10/29/19 with Kerin Ransom

## 2019-10-17 ENCOUNTER — Telehealth (HOSPITAL_COMMUNITY): Payer: Self-pay

## 2019-10-17 NOTE — Telephone Encounter (Signed)
Patient contacted regarding discharge from [facility] 11/18.  Patient understands to follow up with provider Kerin Ransom PA on 10/29/19 at 8:30 AM at [facility]. Patient understands discharge instructions? YES Patient understands medications and regiment? YES Patient understands to bring all medications to this visit? YES

## 2019-10-17 NOTE — Telephone Encounter (Signed)
Attempted to call patient in regards to Cardiac Rehab - LM on VM 

## 2019-10-17 NOTE — Telephone Encounter (Signed)
LM TO CALL BACK ./CY 

## 2019-10-29 ENCOUNTER — Ambulatory Visit (INDEPENDENT_AMBULATORY_CARE_PROVIDER_SITE_OTHER): Payer: 59 | Admitting: Cardiology

## 2019-10-29 ENCOUNTER — Other Ambulatory Visit: Payer: Self-pay

## 2019-10-29 ENCOUNTER — Encounter: Payer: Self-pay | Admitting: Cardiology

## 2019-10-29 ENCOUNTER — Encounter (HOSPITAL_COMMUNITY): Payer: Self-pay

## 2019-10-29 VITALS — BP 117/70 | HR 48 | Ht 66.0 in | Wt 184.4 lb

## 2019-10-29 DIAGNOSIS — Z9861 Coronary angioplasty status: Secondary | ICD-10-CM

## 2019-10-29 DIAGNOSIS — E785 Hyperlipidemia, unspecified: Secondary | ICD-10-CM

## 2019-10-29 DIAGNOSIS — I2129 ST elevation (STEMI) myocardial infarction involving other sites: Secondary | ICD-10-CM | POA: Diagnosis not present

## 2019-10-29 DIAGNOSIS — I2510 Atherosclerotic heart disease of native coronary artery without angina pectoris: Secondary | ICD-10-CM | POA: Insufficient documentation

## 2019-10-29 DIAGNOSIS — I10 Essential (primary) hypertension: Secondary | ICD-10-CM | POA: Diagnosis not present

## 2019-10-29 MED ORDER — METOPROLOL TARTRATE 25 MG PO TABS: 12.5000 mg | ORAL_TABLET | Freq: Two times a day (BID) | ORAL | 2 refills | Status: DC

## 2019-10-29 MED ORDER — TICAGRELOR 90 MG PO TABS: 90.0000 mg | ORAL_TABLET | Freq: Two times a day (BID) | ORAL | 2 refills | Status: DC

## 2019-10-29 NOTE — Assessment & Plan Note (Signed)
Urgent OM1 PCI 10/13/2019- distal embolization of OM1, residual 50% pLAD, 20-30% RCA, normal LVF 

## 2019-10-29 NOTE — Assessment & Plan Note (Signed)
LDL 127- Lipitor added Nov 2020

## 2019-10-29 NOTE — Patient Instructions (Addendum)
Medication Instructions:  Your physician recommends that you continue on your current medications as directed. Please refer to the Current Medication list given to you today. *If you need a refill on your cardiac medications before your next appointment, please call your pharmacy*  Lab Work: Your physician recommends that you return for lab work in: BEFORE NEXT APPT CMET, LIPID (week of 01/12/2020) If you have labs (blood work) drawn today and your tests are completely normal, you will receive your results only by: Marland Kitchen MyChart Message (if you have MyChart) OR . A paper copy in the mail If you have any lab test that is abnormal or we need to change your treatment, we will call you to review the results.  Testing/Procedures: NONE   Follow-Up: At [facility], you and your health needs are our priority.  As part of our continuing mission to provide you with exceptional heart care, we have created designated Provider Care Teams.  These Care Teams include your primary Cardiologist (physician) and Advanced Practice Providers (APPs -  Physician Assistants and Nurse Practitioners) who all work together to provide you with the care you need, when you need it.  Your next appointment:    FOLLOW UP AS SCHEDULED  The format for your next appointment:   In Person  Provider:   Quay Burow, MD  Other Instructions RETURN TO WORK NOTE GIVEN
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
## 2019-10-29 NOTE — Addendum Note (Signed)
Addended by: Ulice Brilliant T on: 10/29/2019 09:13 AM   Modules accepted: Orders

## 2019-10-29 NOTE — Assessment & Plan Note (Signed)
Normal LVF, no LVH on echo 

## 2019-10-29 NOTE — Assessment & Plan Note (Signed)
Pt presented 10/13/2019 with STEMI- OM1 PCI/DES 

## 2019-10-29 NOTE — Progress Notes (Signed)
Clinical review of pt follow up appt on 10/29/19 with Kerin Ransom, PA-C - cardiologist office note. Pt is making the expected progress in recovery.  Pt appropriate for scheduling for on site cardiac rehab and/or enrollment in Virtual Cardiac Rehab.  Pt Covid score is 4. It is noted at time of introduction to CR, patient preferred to do virtual CR only. Will forward to staff for follow up. Eyanna Mcgonagle E. Laray Anger, BSN

## 2019-11-10 ENCOUNTER — Telehealth (HOSPITAL_COMMUNITY): Payer: Self-pay

## 2019-11-10 ENCOUNTER — Encounter (HOSPITAL_COMMUNITY): Payer: Self-pay

## 2019-11-10 ENCOUNTER — Telehealth (HOSPITAL_COMMUNITY): Payer: Self-pay | Admitting: *Deleted

## 2019-11-10 NOTE — Telephone Encounter (Signed)
Attempted to call patient in regards to Cardiac Rehab - LM on VM Mailed letter 

## 2019-11-10 NOTE — Telephone Encounter (Signed)
Pt returned call from message left earlier by Cardiac Rehab staff.  Pt returned to work and is already exercising on his own pt not interested in participating in Virtual cardiac rehab.  Pt only has a flip phone and he does not have insurance coverage for outpatient. Reviewed with pt general guidelines for exercise 30-45 minutes each day.  Pt verbalized understanding.  Pt asked about his medication refills at [facility].  Called and spoke to [place] who did receive the new prescritpions.  Too early for him to refill - last refilled was on 11/18.  Asked for the medications to be filled.  Able to fill but only for 30 days his insurance will not pay for 90 day supply.  Advised pt that he could pick his metoprolol and Brilinta later today or tomorrow. Pt thanked me for the call.  Cherre Huger, BSN Cardiac and Training and development officer

## 2019-12-23 ENCOUNTER — Telehealth: Payer: Self-pay

## 2019-12-23 NOTE — Telephone Encounter (Signed)
Left message for patient to contact office. I need to see if patient has completed a release for [employer] Paperwork that [place] completed on 10/29/2019.

## 2020-01-05 NOTE — Telephone Encounter (Signed)
Follow Up  Patient is returning call about paperwork. Please give patient a call back.

## 2020-01-06 NOTE — Telephone Encounter (Signed)
Left Message for patient to come to the office to complete a release for the FMLA paperwork that was completed back in November/December 2020. (spoke with Aram Beecham in medical records and she stated patient needs to complete a release to make sure all parties are covered.) Copy of FMLA forms were given to Medical Records to be scanned in to chart as well.

## 2020-01-07 LAB — COMPREHENSIVE METABOLIC PANEL
ALT: 10 IU/L (ref 0–44)
AST: 11 IU/L (ref 0–40)
Albumin/Globulin Ratio: 1.6 (ref 1.2–2.2)
Albumin: 4 g/dL (ref 3.8–4.8)
Alkaline Phosphatase: 95 IU/L (ref 39–117)
BUN/Creatinine Ratio: 16 (ref 10–24)
BUN: 23 mg/dL (ref 8–27)
Bilirubin Total: 1.1 mg/dL (ref 0.0–1.2)
CO2: 24 mmol/L (ref 20–29)
Calcium: 9.6 mg/dL (ref 8.6–10.2)
Chloride: 101 mmol/L (ref 96–106)
Creatinine, Ser: 1.48 mg/dL — ABNORMAL HIGH (ref 0.76–1.27)
GFR calc Af Amer: 55 mL/min/{1.73_m2} — ABNORMAL LOW (ref >59)
GFR calc non Af Amer: 48 mL/min/{1.73_m2} — ABNORMAL LOW (ref >59)
Globulin, Total: 2.5 g/dL (ref 1.5–4.5)
Glucose: 97 mg/dL (ref 65–99)
Potassium: 4.3 mmol/L (ref 3.5–5.2)
Sodium: 139 mmol/L (ref 134–144)
Total Protein: 6.5 g/dL (ref 6.0–8.5)

## 2020-01-07 LAB — LIPID PANEL
Chol/HDL Ratio: 3.8 ratio (ref 0.0–5.0)
Cholesterol, Total: 123 mg/dL (ref 100–199)
HDL: 32 mg/dL — ABNORMAL LOW (ref >39)
LDL Chol Calc (NIH): 71 mg/dL (ref 0–99)
Triglycerides: 108 mg/dL (ref 0–149)
VLDL Cholesterol Cal: 20 mg/dL (ref 5–40)

## 2020-01-09 ENCOUNTER — Telehealth: Payer: Self-pay | Admitting: Cardiovascular Disease

## 2020-01-09 MED ORDER — ATORVASTATIN CALCIUM 80 MG PO TABS: 80.0000 mg | ORAL_TABLET | Freq: Every day | ORAL | 3 refills | Status: DC

## 2020-01-09 NOTE — Telephone Encounter (Signed)
Patient is returning phone call.  °

## 2020-01-09 NOTE — Telephone Encounter (Signed)
Spoke to patient, aware of lab results and verbalized understanding.   Request new rx for atorvastatin.  rx sent.     F/u with Dr. Allyson Sabal 2/23.

## 2020-01-20 ENCOUNTER — Encounter (INDEPENDENT_AMBULATORY_CARE_PROVIDER_SITE_OTHER): Payer: Self-pay

## 2020-01-20 ENCOUNTER — Encounter: Payer: Self-pay | Admitting: Cardiovascular Disease

## 2020-01-20 ENCOUNTER — Other Ambulatory Visit: Payer: Self-pay

## 2020-01-20 ENCOUNTER — Ambulatory Visit (INDEPENDENT_AMBULATORY_CARE_PROVIDER_SITE_OTHER): Payer: 59 | Admitting: Cardiovascular Disease

## 2020-01-20 DIAGNOSIS — I2121 ST elevation (STEMI) myocardial infarction involving left circumflex coronary artery: Secondary | ICD-10-CM

## 2020-01-20 DIAGNOSIS — I10 Essential (primary) hypertension: Secondary | ICD-10-CM

## 2020-01-20 DIAGNOSIS — E785 Hyperlipidemia, unspecified: Secondary | ICD-10-CM | POA: Diagnosis not present

## 2020-01-20 NOTE — Assessment & Plan Note (Signed)
History of essential hypertension with blood pressure measured today 126/66.  He is on metoprolol.

## 2020-01-20 NOTE — Progress Notes (Signed)
01/20/2020 Edward Arias   Jan 01, 1951  [id number]  Primary Physician Edward Skeans, MD Primary Cardiologist: Edward Gess MD Edward Arias, Edward Arias  HPI:  Edward Arias is a 69 y.o. mild to moderately overweight single Caucasian male with no children who works part-time for [employer] where he has been for 40 years.  His prior primary care provider is Edward Arias.  His risk factors include treated hypertension and hyperlipidemia.  He had a STEMI 10/13/2019 and underwent PCI and drug-eluting stenting by Edward Arias with a radial approach of an occluded obtuse marginal branch.  He is on aspirin and Brilinta.  He denies chest pain or shortness of breath.   Current Meds  Medication Sig  . aspirin EC 81 MG tablet Take 81 mg by mouth daily.  Marland Kitchen atorvastatin (LIPITOR) 80 MG tablet Take 1 tablet (80 mg total) by mouth daily at 6 PM.  . Cyanocobalamin (VITAMIN B-12 PO) Take 1 tablet by mouth daily.  Marland Kitchen lisinopril-hydrochlorothiazide (PRINZIDE,ZESTORETIC) 20-25 MG per tablet Take 1 tablet by mouth daily.  . metoprolol tartrate (LOPRESSOR) 25 MG tablet Take 0.5 tablets (12.5 mg total) by mouth 2 (two) times daily.  . Multiple Vitamins-Minerals (CENTRUM ADULTS PO) Take 1 tablet by mouth daily.  . nitroGLYCERIN (NITROSTAT) 0.4 MG SL tablet Place 1 tablet (0.4 mg total) under the tongue every 5 (five) minutes as needed for chest pain.  . ticagrelor (BRILINTA) 90 MG TABS tablet Take 1 tablet (90 mg total) by mouth 2 (two) times daily.  . [DISCONTINUED] omega-3 acid ethyl esters (LOVAZA) 1 g capsule Take 1 g by mouth daily.     No Known Allergies  Social History   Socioeconomic History  . Marital status: Unknown    Spouse name: Not on file  . Number of children: Not on file  . Years of education: Not on file  . Highest education level: Not on file  Occupational History  . Not on file  Tobacco Use  . Smoking status: Never Smoker  . Smokeless tobacco: Current User    Types: Chew   Substance and Sexual Activity  . Alcohol use: No  . Drug use: No  . Sexual activity: Not Currently  Other Topics Concern  . Not on file  Social History Narrative  . Not on file   Social Determinants of Health   Financial Resource Strain:   . Difficulty of Paying Living Expenses: Not on file  Food Insecurity:   . Worried About Programme researcher, broadcasting/film/video in the Last Year: Not on file  . Ran Out of Food in the Last Year: Not on file  Transportation Needs:   . Lack of Transportation (Medical): Not on file  . Lack of Transportation (Non-Medical): Not on file  Physical Activity:   . Days of Exercise per Week: Not on file  . Minutes of Exercise per Session: Not on file  Stress:   . Feeling of Stress : Not on file  Social Connections:   . Frequency of Communication with Friends and Family: Not on file  . Frequency of Social Gatherings with Friends and Family: Not on file  . Attends Religious Services: Not on file  . Active Member of Clubs or Organizations: Not on file  . Attends Banker Meetings: Not on file  . Marital Status: Not on file  Intimate Partner Violence:   . Fear of Current or Ex-Partner: Not on file  . Emotionally Abused: Not on file  . Physically  Abused: Not on file  . Sexually Abused: Not on file     Review of Systems: General: negative for chills, fever, night sweats or weight changes.  Cardiovascular: negative for chest pain, dyspnea on exertion, edema, orthopnea, palpitations, paroxysmal nocturnal dyspnea or shortness of breath Dermatological: negative for rash Respiratory: negative for cough or wheezing Urologic: negative for hematuria Abdominal: negative for nausea, vomiting, diarrhea, bright red blood per rectum, melena, or hematemesis Neurologic: negative for visual changes, syncope, or dizziness All other systems reviewed and are otherwise negative except as noted above.    Blood pressure 126/66, pulse (!) 48, temperature 98.2 F (36.8 C),  height 5\' 6"  (1.676 m), weight 180 lb (81.6 kg).  General appearance: alert and no distress Neck: no adenopathy, no carotid bruit, no JVD, supple, symmetrical, trachea midline and thyroid not enlarged, symmetric, no tenderness/mass/nodules Lungs: clear to auscultation bilaterally Heart: regular rate and rhythm, S1, S2 normal, no murmur, click, rub or gallop Extremities: extremities normal, atraumatic, no cyanosis or edema Pulses: 2+ and symmetric Skin: Skin color, texture, turgor normal. No rashes or lesions Neurologic: Alert and oriented X 3, normal strength and tone. Normal symmetric reflexes. Normal coordination and gait  EKG not performed today  ASSESSMENT AND PLAN:   Dyslipidemia, goal LDL below 70 History of hyperlipidemia on high-dose statin therapy with lipid profile performed 01/07/2020 revealing total cholesterol 123, LDL 71 and HDL 32.  Essential hypertension, benign History of essential hypertension with blood pressure measured today 126/66.  He is on metoprolol.  STEMI (ST elevation myocardial infarction) ([place]) History of CAD status post STEMI 10/13/2019 treated with PCI and drug-eluting stenting by Dr. Gerald Arias End of an occluded first obtuse marginal branch.  EF was 45 to 50% by visual estimation with inferoapical hypokinesia.  He had scattered disease otherwise with 50% proximal LAD stenosis after the first diagonal branch and 30% RCA.  His EF was normal by 2D echo.  He is on dual antiplatelet therapy including aspirin and Brilinta.  He denies chest pain or shortness of breath.      Edward Harp MD FACP,FACC,FAHA, [facility] 01/20/2020 10:57 AM

## 2020-01-20 NOTE — Assessment & Plan Note (Signed)
History of CAD status post STEMI 10/13/2019 treated with PCI and drug-eluting stenting by Dr. Thayer Ohm End of an occluded first obtuse marginal branch.  EF was 45 to 50% by visual estimation with inferoapical hypokinesia.  He had scattered disease otherwise with 50% proximal LAD stenosis after the first diagonal branch and 30% RCA.  His EF was normal by 2D echo.  He is on dual antiplatelet therapy including aspirin and Brilinta.  He denies chest pain or shortness of breath.

## 2020-01-20 NOTE — Assessment & Plan Note (Signed)
History of hyperlipidemia on high-dose statin therapy with lipid profile performed 01/07/2020 revealing total cholesterol 123, LDL 71 and HDL 32.

## 2020-01-20 NOTE — Patient Instructions (Signed)
Medication Instructions:  Your physician recommends that you continue on your current medications as directed. Please refer to the Current Medication list given to you today.  If you need a refill on your cardiac medications before your next appointment, please call your pharmacy.   Lab work: NONE  Testing/Procedures: NONE  Follow-Up: At [facility], you and your health needs are our priority.  As part of our continuing mission to provide you with exceptional heart care, we have created designated Provider Care Teams.  These Care Teams include your primary Cardiologist (physician) and Advanced Practice Providers (APPs -  Physician Assistants and Nurse Practitioners) who all work together to provide you with the care you need, when you need it. You may see Jonathan Berry, MD or one of the following Advanced Practice Providers on your designated Care Team:    Luke Kilroy, PA-C  Callie Goodrich, PA-C  Jesse Cleaver, FNP  Your physician wants you to follow-up in: 6 months with Luke Kilroy, PA-C and 1 year with Dr. Berry. You will receive a reminder letter in the mail two months in advance. If you don't receive a letter, please call our office to schedule the follow-up appointment.      

## 2020-06-03 ENCOUNTER — Other Ambulatory Visit: Payer: Self-pay

## 2020-06-03 MED ORDER — TICAGRELOR 90 MG PO TABS: 90.0000 mg | ORAL_TABLET | Freq: Two times a day (BID) | ORAL | 2 refills | Status: DC

## 2020-06-30 ENCOUNTER — Other Ambulatory Visit: Payer: Self-pay

## 2020-06-30 MED ORDER — TICAGRELOR 90 MG PO TABS: 90.0000 mg | ORAL_TABLET | Freq: Two times a day (BID) | ORAL | 2 refills | Status: DC

## 2020-08-25 ENCOUNTER — Ambulatory Visit (INDEPENDENT_AMBULATORY_CARE_PROVIDER_SITE_OTHER): Payer: 59 | Admitting: Cardiology

## 2020-08-25 ENCOUNTER — Other Ambulatory Visit: Payer: Self-pay

## 2020-08-25 ENCOUNTER — Encounter: Payer: Self-pay | Admitting: Cardiology

## 2020-08-25 VITALS — BP 148/74 | HR 47 | Ht 66.0 in | Wt 180.6 lb

## 2020-08-25 DIAGNOSIS — I10 Essential (primary) hypertension: Secondary | ICD-10-CM | POA: Diagnosis not present

## 2020-08-25 DIAGNOSIS — I2510 Atherosclerotic heart disease of native coronary artery without angina pectoris: Secondary | ICD-10-CM

## 2020-08-25 DIAGNOSIS — I252 Old myocardial infarction: Secondary | ICD-10-CM | POA: Diagnosis not present

## 2020-08-25 DIAGNOSIS — E785 Hyperlipidemia, unspecified: Secondary | ICD-10-CM | POA: Diagnosis not present

## 2020-08-25 DIAGNOSIS — Z9861 Coronary angioplasty status: Secondary | ICD-10-CM

## 2020-08-25 MED ORDER — METOPROLOL TARTRATE 25 MG PO TABS: 12.5000 mg | ORAL_TABLET | Freq: Two times a day (BID) | ORAL | 3 refills | Status: DC

## 2020-08-25 MED ORDER — ATORVASTATIN CALCIUM 80 MG PO TABS: 80.0000 mg | ORAL_TABLET | Freq: Every day | ORAL | 3 refills | Status: DC

## 2020-08-25 MED ORDER — TICAGRELOR 90 MG PO TABS: 90.0000 mg | ORAL_TABLET | Freq: Two times a day (BID) | ORAL | 0 refills | Status: DC
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
## 2020-08-25 NOTE — Assessment & Plan Note (Signed)
Pt presented 10/13/2019 with STEMI- OM1 PCI/DES

## 2020-08-25 NOTE — Assessment & Plan Note (Signed)
Normal LVF, no LVH on echo

## 2020-08-25 NOTE — Patient Instructions (Signed)
Medication Instructions:  Continue current medications  *If you need a refill on your cardiac medications before your next appointment, please call your pharmacy*   Lab Work: Fasting Lipids  If you have labs (blood work) drawn today and your tests are completely normal, you will receive your results only by: Marland Kitchen MyChart Message (if you have MyChart) OR . A paper copy in the mail If you have any lab test that is abnormal or we need to change your treatment, we will call you to review the results.   Testing/Procedures: None Ordered   Follow-Up: At [facility], you and your health needs are our priority.  As part of our continuing mission to provide you with exceptional heart care, we have created designated Provider Care Teams.  These Care Teams include your primary Cardiologist (physician) and Advanced Practice Providers (APPs -  Physician Assistants and Nurse Practitioners) who all work together to provide you with the care you need, when you need it.  We recommend signing up for the patient portal called "MyChart".  Sign up information is provided on this After Visit Summary.  MyChart is used to connect with patients for Virtual Visits (Telemedicine).  Patients are able to view lab/test results, encounter notes, upcoming appointments, etc.  Non-urgent messages can be sent to your provider as well.   To learn more about what you can do with MyChart, go to [email or web address].    Your next appointment:   4 month(s)  The format for your next appointment:   In Person  Provider:   You may see Nanetta Batty, MD or one of the following Advanced Practice Providers on your designated Care Team:    Corine Shelter, PA-C  [place], [place]  Edd Fabian, [place]

## 2020-08-25 NOTE — Assessment & Plan Note (Signed)
Urgent OM1 PCI 10/13/2019- distal embolization of OM1, residual 50% pLAD, 20-30% RCA, normal LVF

## 2020-08-25 NOTE — Assessment & Plan Note (Signed)
LDL 127- Lipitor added Nov 2020 Check f/u lipids

## 2020-09-01 ENCOUNTER — Telehealth: Payer: Self-pay | Admitting: Cardiovascular Disease

## 2020-09-01 NOTE — Telephone Encounter (Signed)
Patient states he is returning a call.

## 2020-09-01 NOTE — Telephone Encounter (Signed)
Called the patient back to let him know that I could not find any documentation about someone from our office calling him this morning but if we needed to get back in touch we would call again. He verbalized understanding.

## 2020-09-14 LAB — LIPID PANEL
Chol/HDL Ratio: 3.9 ratio (ref 0.0–5.0)
Cholesterol, Total: 136 mg/dL (ref 100–199)
HDL: 35 mg/dL — ABNORMAL LOW (ref >39)
LDL Chol Calc (NIH): 73 mg/dL (ref 0–99)
Triglycerides: 162 mg/dL — ABNORMAL HIGH (ref 0–149)
VLDL Cholesterol Cal: 28 mg/dL (ref 5–40)

## 2020-09-17 ENCOUNTER — Telehealth: Payer: Self-pay | Admitting: Cardiovascular Disease

## 2020-09-17 NOTE — Telephone Encounter (Signed)
Patient is returning call to discuss lab results. 

## 2020-09-17 NOTE — Telephone Encounter (Signed)
Patient made aware of results and verbalized understanding.  Please let the patient know his lipids looked good- same Rx.  Corine Shelter PA-C 09/14/2020 6:02 PM

## 2020-11-10 ENCOUNTER — Telehealth: Payer: Self-pay | Admitting: *Deleted

## 2020-11-10 MED ORDER — TICAGRELOR 60 MG PO TABS: 60.0000 mg | ORAL_TABLET | Freq: Two times a day (BID) | ORAL | 3 refills | Status: DC

## 2020-11-10 NOTE — Telephone Encounter (Signed)
Leave message for pt to call back 

## 2020-11-10 NOTE — Telephone Encounter (Signed)
Patient returning call.

## 2020-11-10 NOTE — Telephone Encounter (Signed)
-----   Message from Abelino Derrick, [place] sent at 08/31/2020  4:11 PM EDT ----- Can you let Mr Botto know we plan to decrease his Brilinta dose to 60 mg BID starting nov 17th.  He'll need a new Rx for this.  Corine Shelter PA-C 08/31/2020 4:13 PM

## 2020-11-10 NOTE — Telephone Encounter (Signed)
Pt aware that his brilinta was reduce to 60 mg by mouth twice a day, new rx send to pharmacy

## 2020-11-10 NOTE — Telephone Encounter (Signed)
-----   Message from Luke K Kilroy, PA-C sent at 08/31/2020  4:11 PM EDT ----- Can you let Mr Caplin know we plan to decrease his Brilinta dose to 60 mg BID starting nov 17th.  He'll need a new Rx for this.  LUKE KILROY PA-C 08/31/2020 4:13 PM    

## 2020-11-10 NOTE — Telephone Encounter (Signed)
Spoke with pt about his medication changes

## 2020-11-28 ENCOUNTER — Encounter (HOSPITAL_COMMUNITY): Payer: Self-pay

## 2020-11-28 ENCOUNTER — Other Ambulatory Visit: Payer: Self-pay

## 2020-11-28 ENCOUNTER — Ambulatory Visit (HOSPITAL_COMMUNITY)
Admission: EM | Admit: 2020-11-28 | Discharge: 2020-11-28 | Disposition: A | Discharge status: Home / Self Care | Payer: 59 | Attending: Student | Admitting: Student

## 2020-11-28 DIAGNOSIS — M10049 Idiopathic gout, unspecified hand: Secondary | ICD-10-CM | POA: Insufficient documentation

## 2020-11-28 LAB — CBC
HCT: 44.1 % (ref 39.0–52.0)
Hemoglobin: 15.4 g/dL (ref 13.0–17.0)
MCH: 29.7 pg (ref 26.0–34.0)
MCHC: 34.9 g/dL (ref 30.0–36.0)
MCV: 85 fL (ref 80.0–100.0)
Platelets: 311 10*3/uL (ref 150–400)
RBC: 5.19 MIL/uL (ref 4.22–5.81)
RDW: 13.6 % (ref 11.5–15.5)
WBC: 10.9 10*3/uL — ABNORMAL HIGH (ref 4.0–10.5)
nRBC: 0 % (ref 0.0–0.2)

## 2020-11-28 LAB — URIC ACID: Uric Acid, Serum: 8.6 mg/dL (ref 3.7–8.6)

## 2020-11-28 MED ORDER — PREDNISONE 20 MG PO TABS: 40.0000 mg | ORAL_TABLET | Freq: Every day | ORAL | 0 refills | Status: AC
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
## 2020-11-28 NOTE — Discharge Instructions (Addendum)
-  Start the prednisone for your gout. Take this once daily for 5 days. Take 2 pills (40mg ) in the morning.  -We'll call you if your labwork is abnormal. -Seek additional medical attention if you develop fevers/chills, chest pain, shortness of breath, worsening of finger swelling/pain despite treatment.

## 2020-11-28 NOTE — ED Triage Notes (Signed)
Patient states he has had bilateral hand pain x 1 week with the left being worse than the right. Pt states it is usually just his fingers that swell up and hurt. Pt has a hx of gout. Pt is aox4 and ambulatory.

## 2020-12-22 ENCOUNTER — Ambulatory Visit (INDEPENDENT_AMBULATORY_CARE_PROVIDER_SITE_OTHER): Payer: 59 | Admitting: Cardiovascular Disease

## 2020-12-22 ENCOUNTER — Encounter: Payer: Self-pay | Admitting: Cardiovascular Disease

## 2020-12-22 ENCOUNTER — Other Ambulatory Visit: Payer: Self-pay

## 2020-12-22 VITALS — BP 118/64 | HR 62 | Ht 66.0 in | Wt 182.6 lb

## 2020-12-22 DIAGNOSIS — I10 Essential (primary) hypertension: Secondary | ICD-10-CM

## 2020-12-22 DIAGNOSIS — I2121 ST elevation (STEMI) myocardial infarction involving left circumflex coronary artery: Secondary | ICD-10-CM | POA: Diagnosis not present

## 2020-12-22 DIAGNOSIS — E785 Hyperlipidemia, unspecified: Secondary | ICD-10-CM

## 2020-12-22 DIAGNOSIS — I2510 Atherosclerotic heart disease of native coronary artery without angina pectoris: Secondary | ICD-10-CM

## 2020-12-22 DIAGNOSIS — I252 Old myocardial infarction: Secondary | ICD-10-CM | POA: Diagnosis not present

## 2020-12-22 DIAGNOSIS — Z9861 Coronary angioplasty status: Secondary | ICD-10-CM

## 2020-12-22 NOTE — Assessment & Plan Note (Signed)
History of dyslipidemia on statin therapy with lipid profile performed 09/14/2020 revealing total cholesterol 136, LDL 73 and HDL of 35.

## 2020-12-22 NOTE — Assessment & Plan Note (Signed)
History of CAD status post lateral STEMI 10/13/2019 with PCI drug-eluting stenting of an occluded obtuse marginal branch by Dr. Okey Dupre.  He has been on aspirin Brilinta.  He is otherwise asymptomatic and pain-free.  I believe it is safe now to discontinue the Brilinta.

## 2020-12-22 NOTE — Patient Instructions (Signed)
Medication Instructions:  Stop taking Brilinta.   *If you need a refill on your cardiac medications before your next appointment, please call your pharmacy*   Follow-Up: At [facility], you and your health needs are our priority.  As part of our continuing mission to provide you with exceptional heart care, we have created designated Provider Care Teams.  These Care Teams include your primary Cardiologist (physician) and Advanced Practice Providers (APPs -  Physician Assistants and Nurse Practitioners) who all work together to provide you with the care you need, when you need it.  We recommend signing up for the patient portal called "MyChart".  Sign up information is provided on this After Visit Summary.  MyChart is used to connect with patients for Virtual Visits (Telemedicine).  Patients are able to view lab/test results, encounter notes, upcoming appointments, etc.  Non-urgent messages can be sent to your provider as well.   To learn more about what you can do with MyChart, go to [email or web address].    Your next appointment:   12 month(s)  The format for your next appointment:   In Person  Provider:   Nanetta Batty, MD

## 2020-12-22 NOTE — Progress Notes (Signed)
12/22/2020 Edward Arias   1951-08-20  [id number]  Primary Physician Georganna Skeans, MD Primary Cardiologist: Runell Gess MD Nicholes Calamity, [place]  HPI:  Edward Arias is a 70 y.o.  mild to moderately overweight single Caucasian male with no children who works part-time for [employer] where he has been for 40 years.  His prior primary care provider is Georganna Skeans.    I last saw him in the office 01/20/2020.  His risk factors include treated hypertension and hyperlipidemia.  He had a STEMI 10/13/2019 and underwent PCI and drug-eluting stenting by Dr. Okey Dupre with a radial approach of an occluded obtuse marginal branch.  He is on aspirin and Brilinta.   Since I saw him a year ago he continues to do well.  He remains on DAPT.  He denies chest pain or shortness of breath.  Current Meds  Medication Sig  . aspirin EC 81 MG tablet Take 81 mg by mouth daily.  Marland Kitchen atorvastatin (LIPITOR) 80 MG tablet Take 1 tablet (80 mg total) by mouth daily at 6 PM.  . Cyanocobalamin (VITAMIN B-12 PO) Take 1 tablet by mouth daily.  . ferrous sulfate 325 (65 FE) MG EC tablet Take 325 mg by mouth 3 (three) times daily with meals.  Marland Kitchen lisinopril-hydrochlorothiazide (PRINZIDE,ZESTORETIC) 20-25 MG per tablet Take 1 tablet by mouth daily.  . metoprolol tartrate (LOPRESSOR) 25 MG tablet Take 0.5 tablets (12.5 mg total) by mouth 2 (two) times daily.  . Multiple Vitamins-Minerals (CENTRUM ADULTS PO) Take 1 tablet by mouth daily.  . nitroGLYCERIN (NITROSTAT) 0.4 MG SL tablet Place 1 tablet (0.4 mg total) under the tongue every 5 (five) minutes as needed for chest pain.  . Omega-3 1000 MG CAPS Take by mouth.  . [DISCONTINUED] ticagrelor (BRILINTA) 60 MG TABS tablet Take 1 tablet (60 mg total) by mouth 2 (two) times daily.     Allergies  Allergen Reactions  . Shellfish Allergy Swelling    Causes Gout flareups    Social History   Socioeconomic History  . Marital status: Unknown    Spouse name: Not on file   . Number of children: Not on file  . Years of education: Not on file  . Highest education level: Not on file  Occupational History  . Not on file  Tobacco Use  . Smoking status: Never Smoker  . Smokeless tobacco: Current User    Types: Chew  Vaping Use  . Vaping Use: Never used  Substance and Sexual Activity  . Alcohol use: No  . Drug use: No  . Sexual activity: Not Currently  Other Topics Concern  . Not on file  Social History Narrative  . Not on file   Social Determinants of Health   Financial Resource Strain: Not on file  Food Insecurity: Not on file  Transportation Needs: Not on file  Physical Activity: Not on file  Stress: Not on file  Social Connections: Not on file  Intimate Partner Violence: Not on file     Review of Systems: General: negative for chills, fever, night sweats or weight changes.  Cardiovascular: negative for chest pain, dyspnea on exertion, edema, orthopnea, palpitations, paroxysmal nocturnal dyspnea or shortness of breath Dermatological: negative for rash Respiratory: negative for cough or wheezing Urologic: negative for hematuria Abdominal: negative for nausea, vomiting, diarrhea, bright red blood per rectum, melena, or hematemesis Neurologic: negative for visual changes, syncope, or dizziness All other systems reviewed and are otherwise negative except as noted above.  Blood pressure 118/64, pulse 62, height 5\' 6"  (1.676 m), weight 182 lb 9.6 oz (82.8 kg).  General appearance: alert and no distress Neck: no adenopathy, no carotid bruit, no JVD, supple, symmetrical, trachea midline and thyroid not enlarged, symmetric, no tenderness/mass/nodules Lungs: clear to auscultation bilaterally Heart: regular rate and rhythm, S1, S2 normal, no murmur, click, rub or gallop Extremities: extremities normal, atraumatic, no cyanosis or edema Pulses: 2+ and symmetric Skin: Skin color, texture, turgor normal. No rashes or lesions Neurologic: Alert and  oriented X 3, normal strength and tone. Normal symmetric reflexes. 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  I personally reviewed this EKG.  ASSESSMENT AND PLAN:   Dyslipidemia, goal LDL below 70 History of dyslipidemia on statin therapy with lipid profile performed 09/14/2020 revealing total cholesterol 136, LDL 73 and HDL of 35.  Essential hypertension, benign History of essential hypertension a blood pressure measured today 118/64.  He is on lisinopril, hydrochlorothiazide and metoprolol.  CAD S/P PCI History of CAD status post lateral STEMI 10/13/2019 with PCI drug-eluting stenting of an occluded obtuse marginal branch by Dr. 10/15/2019.  He has been on aspirin Brilinta.  He is otherwise asymptomatic and pain-free.  I believe it is safe now to discontinue the Brilinta.      Okey Dupre MD FACP,FACC,FAHA, [facility] 12/22/2020 1:40 PM

## 2020-12-22 NOTE — Assessment & Plan Note (Signed)
History of essential hypertension a blood pressure measured today 118/64.  He is on lisinopril, hydrochlorothiazide and metoprolol.

## 2021-10-06 ENCOUNTER — Other Ambulatory Visit: Payer: Self-pay

## 2021-10-06 ENCOUNTER — Ambulatory Visit (INDEPENDENT_AMBULATORY_CARE_PROVIDER_SITE_OTHER): Payer: 59 | Admitting: Family Medicine

## 2021-10-06 ENCOUNTER — Encounter: Payer: Self-pay | Admitting: Family Medicine

## 2021-10-06 VITALS — BP 149/83 | HR 45 | Temp 98.2°F | Resp 16 | Ht 66.0 in | Wt 183.3 lb

## 2021-10-06 DIAGNOSIS — Z23 Encounter for immunization: Secondary | ICD-10-CM

## 2021-10-06 DIAGNOSIS — M109 Gout, unspecified: Secondary | ICD-10-CM

## 2021-10-06 DIAGNOSIS — E785 Hyperlipidemia, unspecified: Secondary | ICD-10-CM

## 2021-10-06 DIAGNOSIS — Z7689 Persons encountering health services in other specified circumstances: Secondary | ICD-10-CM

## 2021-10-06 DIAGNOSIS — I10 Essential (primary) hypertension: Secondary | ICD-10-CM | POA: Diagnosis not present

## 2021-10-06 MED ORDER — METOPROLOL TARTRATE 25 MG PO TABS: 12.5000 mg | ORAL_TABLET | Freq: Two times a day (BID) | ORAL | 1 refills | Status: DC

## 2021-10-06 MED ORDER — FERROUS SULFATE 325 (65 FE) MG PO TBEC: 325.0000 mg | DELAYED_RELEASE_TABLET | Freq: Every day | ORAL | 1 refills | Status: DC

## 2021-10-06 MED ORDER — LISINOPRIL-HYDROCHLOROTHIAZIDE 20-25 MG PO TABS: 1.0000 | ORAL_TABLET | Freq: Every day | ORAL | 1 refills | Status: DC

## 2021-10-06 MED ORDER — PREDNISONE 50 MG PO TABS: 50.0000 mg | ORAL_TABLET | Freq: Every day | ORAL | 0 refills | Status: DC

## 2021-10-06 MED ORDER — COLCHICINE 0.6 MG PO TABS: 0.6000 mg | ORAL_TABLET | Freq: Every day | ORAL | 1 refills | Status: DC
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
## 2022-04-11 ENCOUNTER — Ambulatory Visit (INDEPENDENT_AMBULATORY_CARE_PROVIDER_SITE_OTHER): Payer: 59 | Admitting: Family Medicine

## 2022-04-11 ENCOUNTER — Encounter: Payer: Self-pay | Admitting: Family Medicine

## 2022-04-11 VITALS — BP 113/64 | HR 52 | Temp 98.1°F | Resp 16 | Wt 188.0 lb

## 2022-04-11 DIAGNOSIS — E785 Hyperlipidemia, unspecified: Secondary | ICD-10-CM

## 2022-04-11 DIAGNOSIS — I10 Essential (primary) hypertension: Secondary | ICD-10-CM | POA: Diagnosis not present

## 2022-04-11 DIAGNOSIS — R5383 Other fatigue: Secondary | ICD-10-CM | POA: Diagnosis not present

## 2022-04-11 MED ORDER — LISINOPRIL-HYDROCHLOROTHIAZIDE 20-25 MG PO TABS: 1.0000 | ORAL_TABLET | Freq: Every day | ORAL | 1 refills | Status: DC

## 2022-04-11 MED ORDER — METOPROLOL TARTRATE 25 MG PO TABS: 12.5000 mg | ORAL_TABLET | Freq: Two times a day (BID) | ORAL | 1 refills | Status: DC

## 2022-04-11 MED ORDER — ATORVASTATIN CALCIUM 80 MG PO TABS
80.0000 mg | ORAL_TABLET | Freq: Every day | ORAL | 1 refills | Status: DC
Start: 2022-04-11 — End: 2022-10-11

## 2022-04-11 MED ORDER — FERROUS SULFATE 325 (65 FE) MG PO TBEC: 325.0000 mg | DELAYED_RELEASE_TABLET | Freq: Every day | ORAL | 1 refills | Status: DC

## 2022-04-11 NOTE — Progress Notes (Signed)
Patient is here or medication refill. Patient said that he has been feeling fatigue or several weeks. Patient said  that he can sit in the chair and just fall asleep ?
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
## 2022-04-12 LAB — CBC WITH DIFFERENTIAL/PLATELET
Basophils Absolute: 0.1 10*3/uL (ref 0.0–0.2)
Basos: 1 %
EOS (ABSOLUTE): 0.4 10*3/uL (ref 0.0–0.4)
Eos: 5 %
Hematocrit: 45.9 % (ref 37.5–51.0)
Hemoglobin: 15.5 g/dL (ref 13.0–17.7)
Immature Grans (Abs): 0 10*3/uL (ref 0.0–0.1)
Immature Granulocytes: 0 %
Lymphocytes Absolute: 3.3 10*3/uL — ABNORMAL HIGH (ref 0.7–3.1)
Lymphs: 45 %
MCH: 30.1 pg (ref 26.6–33.0)
MCHC: 33.8 g/dL (ref 31.5–35.7)
MCV: 89 fL (ref 79–97)
Monocytes Absolute: 0.8 10*3/uL (ref 0.1–0.9)
Monocytes: 11 %
Neutrophils Absolute: 2.8 10*3/uL (ref 1.4–7.0)
Neutrophils: 38 %
Platelets: 237 10*3/uL (ref 150–450)
RBC: 5.15 x10E6/uL (ref 4.14–5.80)
RDW: 13.5 % (ref 11.6–15.4)
WBC: 7.3 10*3/uL (ref 3.4–10.8)

## 2022-04-12 LAB — CMP14+EGFR
ALT: 20 IU/L (ref 0–44)
AST: 19 IU/L (ref 0–40)
Albumin/Globulin Ratio: 1.6 (ref 1.2–2.2)
Albumin: 3.9 g/dL (ref 3.7–4.7)
Alkaline Phosphatase: 64 IU/L (ref 44–121)
BUN/Creatinine Ratio: 15 (ref 10–24)
BUN: 19 mg/dL (ref 8–27)
Bilirubin Total: 0.4 mg/dL (ref 0.0–1.2)
CO2: 21 mmol/L (ref 20–29)
Calcium: 9.4 mg/dL (ref 8.6–10.2)
Chloride: 99 mmol/L (ref 96–106)
Creatinine, Ser: 1.28 mg/dL — ABNORMAL HIGH (ref 0.76–1.27)
Globulin, Total: 2.4 g/dL (ref 1.5–4.5)
Glucose: 116 mg/dL — ABNORMAL HIGH (ref 70–99)
Potassium: 3.9 mmol/L (ref 3.5–5.2)
Sodium: 141 mmol/L (ref 134–144)
Total Protein: 6.3 g/dL (ref 6.0–8.5)
eGFR: 59 mL/min/{1.73_m2} — ABNORMAL LOW (ref >59)

## 2022-04-12 LAB — LIPID PANEL
Chol/HDL Ratio: 5.1 ratio — ABNORMAL HIGH (ref 0.0–5.0)
Cholesterol, Total: 153 mg/dL (ref 100–199)
HDL: 30 mg/dL — ABNORMAL LOW (ref >39)
LDL Chol Calc (NIH): 69 mg/dL (ref 0–99)
Triglycerides: 340 mg/dL — ABNORMAL HIGH (ref 0–149)
VLDL Cholesterol Cal: 54 mg/dL — ABNORMAL HIGH (ref 5–40)

## 2022-08-07 ENCOUNTER — Other Ambulatory Visit: Payer: Self-pay | Admitting: Family Medicine

## 2022-08-07 MED ORDER — COLCHICINE 0.6 MG PO TABS: 0.6000 mg | ORAL_TABLET | Freq: Every day | ORAL | 1 refills | Status: DC

## 2022-08-07 MED ORDER — PREDNISONE 50 MG PO TABS: 50.0000 mg | ORAL_TABLET | Freq: Every day | ORAL | 0 refills | Status: DC

## 2022-10-11 ENCOUNTER — Encounter: Payer: Self-pay | Admitting: Family Medicine

## 2022-10-11 ENCOUNTER — Ambulatory Visit (INDEPENDENT_AMBULATORY_CARE_PROVIDER_SITE_OTHER): Payer: 59 | Admitting: Family Medicine

## 2022-10-11 VITALS — BP 137/66 | HR 56 | Temp 98.1°F | Resp 16 | Wt 189.8 lb

## 2022-10-11 DIAGNOSIS — E785 Hyperlipidemia, unspecified: Secondary | ICD-10-CM

## 2022-10-11 DIAGNOSIS — I10 Essential (primary) hypertension: Secondary | ICD-10-CM

## 2022-10-11 DIAGNOSIS — M1A9XX0 Chronic gout, unspecified, without tophus (tophi): Secondary | ICD-10-CM

## 2022-10-11 DIAGNOSIS — Z23 Encounter for immunization: Secondary | ICD-10-CM

## 2022-10-11 DIAGNOSIS — R5383 Other fatigue: Secondary | ICD-10-CM | POA: Diagnosis not present

## 2022-10-11 MED ORDER — METOPROLOL TARTRATE 25 MG PO TABS: 12.5000 mg | ORAL_TABLET | Freq: Two times a day (BID) | ORAL | 1 refills | Status: DC

## 2022-10-11 MED ORDER — COLCHICINE 0.6 MG PO TABS: 0.6000 mg | ORAL_TABLET | Freq: Every day | ORAL | 1 refills | Status: DC

## 2022-10-11 MED ORDER — ATORVASTATIN CALCIUM 80 MG PO TABS: 80.0000 mg | ORAL_TABLET | Freq: Every day | ORAL | 1 refills | Status: DC

## 2022-10-11 MED ORDER — LISINOPRIL-HYDROCHLOROTHIAZIDE 20-25 MG PO TABS: 1.0000 | ORAL_TABLET | Freq: Every day | ORAL | 1 refills | Status: DC

## 2022-10-11 MED ORDER — FERROUS SULFATE 325 (65 FE) MG PO TBEC: 325.0000 mg | DELAYED_RELEASE_TABLET | Freq: Every day | ORAL | 1 refills | Status: DC

## 2022-10-11 NOTE — Progress Notes (Unsigned)
Patient is here for their 6 month follow-up Patient has no concerns today Care gaps have been discussed with patient  

## 2022-10-12 ENCOUNTER — Ambulatory Visit: Payer: 59 | Admitting: Family Medicine

## 2022-10-12 ENCOUNTER — Encounter: Payer: Self-pay | Admitting: Family Medicine
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
## 2023-01-11 ENCOUNTER — Encounter: Payer: Self-pay | Admitting: Cardiovascular Disease

## 2023-01-11 ENCOUNTER — Ambulatory Visit: Payer: 59 | Attending: Cardiovascular Disease | Admitting: Cardiovascular Disease

## 2023-01-11 VITALS — BP 130/80 | HR 45 | Ht 66.0 in | Wt 185.4 lb

## 2023-01-11 DIAGNOSIS — R001 Bradycardia, unspecified: Secondary | ICD-10-CM | POA: Diagnosis not present

## 2023-01-11 DIAGNOSIS — E785 Hyperlipidemia, unspecified: Secondary | ICD-10-CM | POA: Diagnosis not present

## 2023-01-11 DIAGNOSIS — I2510 Atherosclerotic heart disease of native coronary artery without angina pectoris: Secondary | ICD-10-CM

## 2023-01-11 DIAGNOSIS — Z9861 Coronary angioplasty status: Secondary | ICD-10-CM

## 2023-01-11 DIAGNOSIS — I10 Essential (primary) hypertension: Secondary | ICD-10-CM | POA: Diagnosis not present

## 2023-01-11 MED ORDER — METOPROLOL TARTRATE 25 MG PO TABS: 12.5000 mg | ORAL_TABLET | Freq: Every day | ORAL | 3 refills | Status: DC

## 2023-01-11 NOTE — Assessment & Plan Note (Signed)
Heart rate of 45 today.  He is asymptomatic from this.  He is on metoprolol tartrate 12.5 mg p.o. twice daily.  I am going to reduce his dose to once a day.

## 2023-01-11 NOTE — Progress Notes (Signed)
01/11/2023 Edward Arias   Dec 13, 1950  [id number]  Primary Physician Edward Mai, MD Primary Cardiologist: Edward Harp MD Edward Arias, [place]  HPI:  Edward Arias is a 72 y.o.  mild to moderately overweight single Caucasian male with no children who works part-time for [employer] where he has been for 39 years.  His prior primary care provider is Edward Arias.    I last saw him in the office 12/22/2020.  His risk factors include treated hypertension and hyperlipidemia.  He had a STEMI 10/13/2019 and underwent PCI and drug-eluting stenting by Dr. Saunders Arias with a radial approach of an occluded obtuse marginal branch.  He is on aspirin and Brilinta.    Since I saw him in the office 2 years ago he continues to do well.  He has stopped his Brilinta and remains on aspirin alone.  He denies chest pain or shortness of breath.   Current Meds  Medication Sig   aspirin EC 81 MG tablet Take 81 mg by mouth daily.   atorvastatin (LIPITOR) 80 MG tablet Take 1 tablet (80 mg total) by mouth daily at 6 PM.   colchicine 0.6 MG tablet Take 1 tablet (0.6 mg total) by mouth daily.   ferrous sulfate 325 (65 FE) MG EC tablet Take 1 tablet (325 mg total) by mouth daily with breakfast.   lisinopril-hydrochlorothiazide (ZESTORETIC) 20-25 MG tablet Take 1 tablet by mouth daily.   Multiple Vitamins-Minerals (CENTRUM ADULTS PO) Take 1 tablet by mouth daily.   nitroGLYCERIN (NITROSTAT) 0.4 MG SL tablet Place 1 tablet (0.4 mg total) under the tongue every 5 (five) minutes as needed for chest pain.   Omega-3 1000 MG CAPS Take by mouth.   [DISCONTINUED] metoprolol tartrate (LOPRESSOR) 25 MG tablet Take 0.5 tablets (12.5 mg total) by mouth 2 (two) times daily.     Allergies  Allergen Reactions   Shellfish Allergy Swelling    Causes Gout flareups Causes Gout flareups   Sulfa Antibiotics Swelling    Causes Gout flareups    Social History   Socioeconomic History   Marital status: Unknown     Spouse name: Not on file   Number of children: Not on file   Years of education: Not on file   Highest education level: Not on file  Occupational History   Not on file  Tobacco Use   Smoking status: Never   Smokeless tobacco: Current    Types: Chew  Vaping Use   Vaping Use: Never used  Substance and Sexual Activity   Alcohol use: No   Drug use: No   Sexual activity: Not Currently  Other Topics Concern   Not on file  Social History Narrative   Not on file   Social Determinants of Health   Financial Resource Strain: Not on file  Food Insecurity: Not on file  Transportation Needs: Not on file  Physical Activity: Not on file  Stress: Not on file  Social Connections: Not on file  Intimate Partner Violence: Not on file     Review of Systems: General: negative for chills, fever, night sweats or weight changes.  Cardiovascular: negative for chest pain, dyspnea on exertion, edema, orthopnea, palpitations, paroxysmal nocturnal dyspnea or shortness of breath Dermatological: negative for rash Respiratory: negative for cough or wheezing Urologic: negative for hematuria Abdominal: negative for nausea, vomiting, diarrhea, bright red blood per rectum, melena, or hematemesis Neurologic: negative for visual changes, syncope, or dizziness All other systems reviewed and are otherwise negative  except as noted above.    Blood pressure 130/80, pulse (!) 45, height 5' 6"$  (1.676 m), weight 185 lb 6.4 oz (84.1 kg), SpO2 98 %.  General appearance: alert and no distress Neck: no adenopathy, no carotid bruit, no JVD, supple, symmetrical, trachea midline, and thyroid not enlarged, symmetric, no tenderness/mass/nodules Lungs: clear to auscultation bilaterally Heart: regular rate and rhythm, S1, S2 normal, no murmur, click, rub or gallop Extremities: extremities normal, atraumatic, no cyanosis or edema Pulses: 2+ and symmetric Skin: Skin color, texture, turgor normal. No rashes or  lesions Neurologic: Grossly normal  EKG sinus bradycardia 45 without ST or T wave changes.  Personally reviewed this EKG.  I  ASSESSMENT AND PLAN:   Dyslipidemia, goal LDL below 70 History of dyslipidemia on high-dose atorvastatin with lipid profile performed 04/11/2022 revealing total cholesterol 133, LDL 69 and HDL 30.  Essential hypertension, benign History of essential hypertension blood pressure measured today at 130/80.  He is on lisinopril, hydrochlorothiazide and metoprolol.  CAD S/P PCI History of CAD status post STEMI 10/13/2019 treated with PCI and drug-eluting stenting of an obtuse marginal branch by Dr. Saunders Arias .  He was on aspirin and Brilinta.  He is only on aspirin now.  He denies chest pain.  Bradycardia, sinus Heart rate of 45 today.  He is asymptomatic from this.  He is on metoprolol tartrate 12.5 mg p.o. twice daily.  I am going to reduce his dose to once a day.     Edward Harp MD FACP,FACC,FAHA, [facility] 01/11/2023 3:01 PM

## 2023-01-11 NOTE — Assessment & Plan Note (Signed)
History of dyslipidemia on high-dose atorvastatin with lipid profile performed 04/11/2022 revealing total cholesterol 133, LDL 69 and HDL 30.

## 2023-01-11 NOTE — Assessment & Plan Note (Signed)
History of CAD status post STEMI 10/13/2019 treated with PCI and drug-eluting stenting of an obtuse marginal branch by Dr. Saunders Revel .  He was on aspirin and Brilinta.  He is only on aspirin now.  He denies chest pain.

## 2023-01-11 NOTE — Assessment & Plan Note (Signed)
History of essential hypertension blood pressure measured today at 130/80.  He is on lisinopril, hydrochlorothiazide and metoprolol.

## 2023-01-11 NOTE — Patient Instructions (Signed)
Medication Instructions:  Your physician has recommended you make the following change in your medication:   -Decrease metoprolol tartrate (lopressor) to 12.78m once daily.  *If you need a refill on your cardiac medications before your next appointment, please call your pharmacy*   Follow-Up: [facility] and your health needs are our priority.  As part of our continuing mission to provide you with exceptional heart care, we have created designated Provider Care Teams.  These Care Teams include your primary Cardiologist (physician) and Advanced Practice Providers (APPs -  Physician Assistants and Nurse Practitioners) who all work together to provide you with the care you need, when you need it.  We recommend signing up for the patient portal called "MyChart".  Sign up information is provided on this After Visit Summary.  MyChart is used to connect with patients for Virtual Visits (Telemedicine).  Patients are able to view lab/test results, encounter notes, upcoming appointments, etc.  Non-urgent messages can be sent to your provider as well.   To learn more about what you can do with MyChart, go to [email or web address]    Your next appointment:   12 month(s)  Provider:   JQuay Burow MD

## 2023-02-20 ENCOUNTER — Encounter: Payer: Self-pay | Admitting: Family Medicine

## 2023-02-20 ENCOUNTER — Ambulatory Visit (INDEPENDENT_AMBULATORY_CARE_PROVIDER_SITE_OTHER): Payer: 59 | Admitting: Family Medicine

## 2023-02-20 VITALS — BP 124/72 | HR 30 | Temp 100.1°F | Resp 16 | Wt 185.4 lb

## 2023-02-20 DIAGNOSIS — J209 Acute bronchitis, unspecified: Secondary | ICD-10-CM | POA: Diagnosis not present

## 2023-02-20 DIAGNOSIS — F17220 Nicotine dependence, chewing tobacco, uncomplicated: Secondary | ICD-10-CM | POA: Diagnosis not present

## 2023-02-20 DIAGNOSIS — N3001 Acute cystitis with hematuria: Secondary | ICD-10-CM | POA: Diagnosis not present

## 2023-02-20 DIAGNOSIS — R300 Dysuria: Secondary | ICD-10-CM

## 2023-02-20 LAB — POCT URINALYSIS DIP (CLINITEK)
Bilirubin, UA: NEGATIVE
Glucose, UA: NEGATIVE mg/dL
Ketones, POC UA: NEGATIVE mg/dL
Nitrite, UA: POSITIVE — AB
POC PROTEIN,UA: 30 — AB
Spec Grav, UA: >=1.03 — AB (ref 1.010–1.025)
Urobilinogen, UA: 1 E.U./dL
pH, UA: 5 (ref 5.0–8.0)

## 2023-02-20 MED ORDER — AMOXICILLIN-POT CLAVULANATE 875-125 MG PO TABS: 1.0000 | ORAL_TABLET | Freq: Two times a day (BID) | ORAL | 0 refills | Status: DC

## 2023-02-20 NOTE — Progress Notes (Signed)
Patient c/o feeling tried weak and overall not feeling well.
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
## 2023-02-22 LAB — URINE CULTURE

## 2023-02-23 ENCOUNTER — Encounter: Payer: Self-pay | Admitting: Family Medicine

## 2023-05-14 ENCOUNTER — Other Ambulatory Visit: Payer: Self-pay | Admitting: Family Medicine

## 2023-05-14 MED ORDER — METOPROLOL TARTRATE 25 MG PO TABS: 12.5000 mg | ORAL_TABLET | Freq: Every day | ORAL | 3 refills | Status: DC

## 2023-05-14 MED ORDER — LISINOPRIL-HYDROCHLOROTHIAZIDE 20-25 MG PO TABS: 1.0000 | ORAL_TABLET | Freq: Every day | ORAL | 1 refills | Status: DC

## 2023-05-14 MED ORDER — COLCHICINE 0.6 MG PO TABS: 0.6000 mg | ORAL_TABLET | Freq: Every day | ORAL | 3 refills | Status: AC

## 2023-07-17 ENCOUNTER — Other Ambulatory Visit: Payer: Self-pay | Admitting: Family Medicine

## 2023-07-17 MED ORDER — PREDNISONE 50 MG PO TABS: 50.0000 mg | ORAL_TABLET | Freq: Every day | ORAL | 0 refills | Status: DC

## 2023-09-27 ENCOUNTER — Ambulatory Visit (INDEPENDENT_AMBULATORY_CARE_PROVIDER_SITE_OTHER): Payer: 59 | Admitting: Family Medicine

## 2023-09-27 ENCOUNTER — Encounter: Payer: Self-pay | Admitting: Family Medicine

## 2023-09-27 VITALS — BP 120/67 | HR 57 | Temp 98.1°F | Resp 16 | Wt 183.2 lb

## 2023-09-27 DIAGNOSIS — R5383 Other fatigue: Secondary | ICD-10-CM

## 2023-09-27 DIAGNOSIS — I10 Essential (primary) hypertension: Secondary | ICD-10-CM

## 2023-09-27 DIAGNOSIS — Z23 Encounter for immunization: Secondary | ICD-10-CM

## 2023-09-27 DIAGNOSIS — E785 Hyperlipidemia, unspecified: Secondary | ICD-10-CM | POA: Diagnosis not present

## 2023-09-27 DIAGNOSIS — M1A9XX0 Chronic gout, unspecified, without tophus (tophi): Secondary | ICD-10-CM

## 2023-09-27 DIAGNOSIS — Z1159 Encounter for screening for other viral diseases: Secondary | ICD-10-CM

## 2023-09-27 MED ORDER — METOPROLOL TARTRATE 25 MG PO TABS: 12.5000 mg | ORAL_TABLET | Freq: Every day | ORAL | 3 refills | Status: DC

## 2023-09-27 MED ORDER — ATORVASTATIN CALCIUM 80 MG PO TABS: 80.0000 mg | ORAL_TABLET | Freq: Every day | ORAL | 1 refills | Status: DC

## 2023-09-27 MED ORDER — LISINOPRIL-HYDROCHLOROTHIAZIDE 20-25 MG PO TABS: 1.0000 | ORAL_TABLET | Freq: Every day | ORAL | 1 refills | Status: DC

## 2023-09-27 MED ORDER — FERROUS SULFATE 325 (65 FE) MG PO TBEC: 325.0000 mg | DELAYED_RELEASE_TABLET | Freq: Every day | ORAL | 1 refills | Status: DC
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
## 2023-09-28 LAB — CBC WITH DIFFERENTIAL/PLATELET
Basophils Absolute: 0.1 10*3/uL (ref 0.0–0.2)
Basos: 1 %
EOS (ABSOLUTE): 0.5 10*3/uL — ABNORMAL HIGH (ref 0.0–0.4)
Eos: 5 %
Hematocrit: 46.6 % (ref 37.5–51.0)
Hemoglobin: 15.3 g/dL (ref 13.0–17.7)
Immature Grans (Abs): 0 10*3/uL (ref 0.0–0.1)
Immature Granulocytes: 0 %
Lymphocytes Absolute: 4 10*3/uL — ABNORMAL HIGH (ref 0.7–3.1)
Lymphs: 40 %
MCH: 29.3 pg (ref 26.6–33.0)
MCHC: 32.8 g/dL (ref 31.5–35.7)
MCV: 89 fL (ref 79–97)
Monocytes Absolute: 1.5 10*3/uL — ABNORMAL HIGH (ref 0.1–0.9)
Monocytes: 16 %
Neutrophils Absolute: 3.7 10*3/uL (ref 1.4–7.0)
Neutrophils: 38 %
Platelets: 247 10*3/uL (ref 150–450)
RBC: 5.22 x10E6/uL (ref 4.14–5.80)
RDW: 13.3 % (ref 11.6–15.4)
WBC: 9.7 10*3/uL (ref 3.4–10.8)

## 2023-09-28 LAB — CMP14+EGFR
ALT: 17 [IU]/L (ref 0–44)
AST: 15 [IU]/L (ref 0–40)
Albumin: 4 g/dL (ref 3.8–4.8)
Alkaline Phosphatase: 82 [IU]/L (ref 44–121)
BUN/Creatinine Ratio: 13 (ref 10–24)
BUN: 17 mg/dL (ref 8–27)
Bilirubin Total: 0.6 mg/dL (ref 0.0–1.2)
CO2: 24 mmol/L (ref 20–29)
Calcium: 9.4 mg/dL (ref 8.6–10.2)
Chloride: 99 mmol/L (ref 96–106)
Creatinine, Ser: 1.34 mg/dL — ABNORMAL HIGH (ref 0.76–1.27)
Globulin, Total: 2.3 g/dL (ref 1.5–4.5)
Glucose: 72 mg/dL (ref 70–99)
Potassium: 3.6 mmol/L (ref 3.5–5.2)
Sodium: 142 mmol/L (ref 134–144)
Total Protein: 6.3 g/dL (ref 6.0–8.5)
eGFR: 56 mL/min/{1.73_m2} — ABNORMAL LOW (ref >59)

## 2023-09-28 LAB — LIPID PANEL
Chol/HDL Ratio: 4.3 ratio (ref 0.0–5.0)
Cholesterol, Total: 147 mg/dL (ref 100–199)
HDL: 34 mg/dL — ABNORMAL LOW (ref >39)
LDL Chol Calc (NIH): 78 mg/dL (ref 0–99)
Triglycerides: 205 mg/dL — ABNORMAL HIGH (ref 0–149)
VLDL Cholesterol Cal: 35 mg/dL (ref 5–40)

## 2023-10-02 ENCOUNTER — Other Ambulatory Visit: Payer: Self-pay | Admitting: Family Medicine

## 2023-10-29 ENCOUNTER — Other Ambulatory Visit: Payer: Self-pay | Admitting: Family Medicine

## 2023-11-01 ENCOUNTER — Encounter: Payer: Self-pay | Admitting: Family Medicine

## 2023-11-01 ENCOUNTER — Ambulatory Visit: Payer: 59 | Admitting: Family Medicine

## 2023-11-01 VITALS — BP 155/79 | HR 49 | Temp 98.1°F | Resp 16 | Wt 187.4 lb

## 2023-11-01 DIAGNOSIS — M1A9XX0 Chronic gout, unspecified, without tophus (tophi): Secondary | ICD-10-CM | POA: Diagnosis not present

## 2023-11-01 DIAGNOSIS — I10 Essential (primary) hypertension: Secondary | ICD-10-CM | POA: Diagnosis not present

## 2023-11-01 MED ORDER — INDOMETHACIN ER 75 MG PO CPCR: 75.0000 mg | ORAL_CAPSULE | Freq: Every day | ORAL | 0 refills | Status: AC
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
## 2023-11-02 LAB — BASIC METABOLIC PANEL
BUN/Creatinine Ratio: 13 (ref 10–24)
BUN: 22 mg/dL (ref 8–27)
CO2: 24 mmol/L (ref 20–29)
Calcium: 9.4 mg/dL (ref 8.6–10.2)
Chloride: 107 mmol/L — ABNORMAL HIGH (ref 96–106)
Creatinine, Ser: 1.63 mg/dL — ABNORMAL HIGH (ref 0.76–1.27)
Glucose: 93 mg/dL (ref 70–99)
Potassium: 4.7 mmol/L (ref 3.5–5.2)
Sodium: 147 mmol/L — ABNORMAL HIGH (ref 134–144)
eGFR: 44 mL/min/{1.73_m2} — ABNORMAL LOW (ref >59)

## 2023-11-02 LAB — URIC ACID: Uric Acid: 8.4 mg/dL (ref 3.8–8.4)

## 2023-11-05 ENCOUNTER — Encounter: Payer: Self-pay | Admitting: Family Medicine

## 2023-12-28 ENCOUNTER — Other Ambulatory Visit: Payer: Self-pay | Admitting: Family Medicine

## 2023-12-28 MED ORDER — PREDNISONE 50 MG PO TABS: 50.0000 mg | ORAL_TABLET | Freq: Every day | ORAL | 0 refills | Status: DC

## 2024-03-21 ENCOUNTER — Other Ambulatory Visit: Payer: Self-pay | Admitting: Family Medicine

## 2024-03-22 ENCOUNTER — Other Ambulatory Visit: Payer: Self-pay | Admitting: Family Medicine

## 2024-03-24 NOTE — Telephone Encounter (Signed)
 Requested Prescriptions  Pending Prescriptions Disp Refills   Ferrous Sulfate  (IRON) 325 (65 Fe) MG TABS [Pharmacy Med Name: Iron 325 (65 Fe) MG Oral Tablet] 90 tablet 0    Sig: Take 1 tablet by mouth once daily with breakfast     Endocrinology:  Minerals - Iron Supplementation Failed - 03/24/2024 11:29 AM      Failed - Fe (serum) in normal range and within 360 days    No results found for: "IRON", "IRONPCTSAT"       Failed - Ferritin in normal range and within 360 days    No results found for: "FERRITIN"       Passed - HGB in normal range and within 360 days    Hemoglobin  Date Value Ref Range Status  09/27/2023 15.3 13.0 - 17.7 g/dL Final         Passed - HCT in normal range and within 360 days    Hematocrit  Date Value Ref Range Status  09/27/2023 46.6 37.5 - 51.0 % Final         Passed - RBC in normal range and within 360 days    RBC  Date Value Ref Range Status  09/27/2023 5.22 4.14 - 5.80 x10E6/uL Final  11/28/2020 5.19 4.22 - 5.81 MIL/uL Final         Passed - Valid encounter within last 12 months    Recent Outpatient Visits           4 months ago Chronic gout without tophus, unspecified cause, unspecified site   [facility], MD   5 months ago Essential hypertension, benign   [place] [facility], MD   1 year ago Acute cystitis with hematuria   [place] [facility], MD   1 year ago Essential hypertension, benign   [place] [facility], MD   1 year ago Essential hypertension    [facility], MD       Future Appointments             In 2 days Abraham Abo, MD [facility]

## 2024-03-26 ENCOUNTER — Ambulatory Visit: Payer: 59 | Admitting: Family Medicine

## 2024-03-26 ENCOUNTER — Encounter: Payer: Self-pay | Admitting: Family Medicine

## 2024-03-26 VITALS — BP 145/80 | HR 57 | Wt 184.8 lb

## 2024-03-26 DIAGNOSIS — S299XXA Unspecified injury of thorax, initial encounter: Secondary | ICD-10-CM

## 2024-03-26 DIAGNOSIS — M1A9XX0 Chronic gout, unspecified, without tophus (tophi): Secondary | ICD-10-CM | POA: Diagnosis not present

## 2024-03-26 DIAGNOSIS — E785 Hyperlipidemia, unspecified: Secondary | ICD-10-CM

## 2024-03-26 DIAGNOSIS — I10 Essential (primary) hypertension: Secondary | ICD-10-CM | POA: Diagnosis not present

## 2024-03-26 DIAGNOSIS — W57XXXA Bitten or stung by nonvenomous insect and other nonvenomous arthropods, initial encounter: Secondary | ICD-10-CM

## 2024-03-26 MED ORDER — METOPROLOL TARTRATE 25 MG PO TABS: 12.5000 mg | ORAL_TABLET | Freq: Every day | ORAL | 1 refills | Status: DC

## 2024-03-26 MED ORDER — DOXYCYCLINE HYCLATE 100 MG PO TABS: 200.0000 mg | ORAL_TABLET | Freq: Once | ORAL | 0 refills | Status: AC

## 2024-03-26 MED ORDER — PREDNISONE 50 MG PO TABS: 50.0000 mg | ORAL_TABLET | Freq: Every day | ORAL | 0 refills | Status: DC

## 2024-03-26 MED ORDER — LISINOPRIL-HYDROCHLOROTHIAZIDE 20-25 MG PO TABS: 1.0000 | ORAL_TABLET | Freq: Every day | ORAL | 1 refills | Status: DC

## 2024-03-27 ENCOUNTER — Encounter: Payer: Self-pay | Admitting: Family Medicine
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
## 2024-06-20 ENCOUNTER — Other Ambulatory Visit: Payer: Self-pay | Admitting: Family Medicine

## 2024-07-14 ENCOUNTER — Other Ambulatory Visit: Payer: Self-pay | Admitting: Family Medicine

## 2024-07-14 MED ORDER — PREDNISONE 50 MG PO TABS: 50.0000 mg | ORAL_TABLET | Freq: Every day | ORAL | 0 refills | Status: DC

## 2024-08-25 ENCOUNTER — Ambulatory Visit: Attending: Cardiovascular Disease | Admitting: Cardiovascular Disease

## 2024-08-25 ENCOUNTER — Encounter: Payer: Self-pay | Admitting: Cardiovascular Disease

## 2024-08-25 VITALS — BP 130/66 | HR 48 | Ht 66.0 in | Wt 188.0 lb

## 2024-08-25 DIAGNOSIS — I10 Essential (primary) hypertension: Secondary | ICD-10-CM | POA: Diagnosis not present

## 2024-08-25 DIAGNOSIS — E785 Hyperlipidemia, unspecified: Secondary | ICD-10-CM | POA: Diagnosis not present

## 2024-08-25 DIAGNOSIS — I252 Old myocardial infarction: Secondary | ICD-10-CM

## 2024-08-25 MED ORDER — METOPROLOL TARTRATE 25 MG PO TABS: 12.5000 mg | ORAL_TABLET | Freq: Every day | ORAL | 4 refills | Status: AC

## 2024-08-25 MED ORDER — LISINOPRIL-HYDROCHLOROTHIAZIDE 20-25 MG PO TABS: 1.0000 | ORAL_TABLET | Freq: Every day | ORAL | 4 refills | Status: AC

## 2024-08-25 MED ORDER — ATORVASTATIN CALCIUM 80 MG PO TABS: 80.0000 mg | ORAL_TABLET | Freq: Every day | ORAL | 4 refills | Status: AC

## 2024-08-25 NOTE — Assessment & Plan Note (Signed)
 History of CAD status post STEMI 10/13/2019 Court underwent PCI and drug-eluting stenting of an occluded obtuse marginal branch by Dr. Mady via the radial approach.  He is on aspirin .  He denies chest pain.

## 2024-08-25 NOTE — Assessment & Plan Note (Signed)
 History of essential hypertension blood pressure measured today at 130/66.  He is on lisinopril , hydrochlorothiazide  and metoprolol .

## 2024-08-25 NOTE — Progress Notes (Signed)
 08/25/2024 Edward Arias   [birth year]  [id number]  Primary Physician Edward Bleacher, MD Primary Cardiologist: Edward JINNY Lesches MD GENI Edward Arias, [place]  HPI:  Edward Arias is a 73 y.o.  mild to moderately overweight single Caucasian male with no children who works part-time for [employer] where he has been for 42 years.  His prior primary care provider is Arias Edward.    I last saw him in the office 01/11/2023.  His risk factors include treated hypertension and hyperlipidemia.  He had a STEMI 10/13/2019 and underwent PCI and drug-eluting stenting by Dr. Mady with a radial approach of an occluded obtuse marginal branch.  He is on aspirin  and Brilinta .    Since I saw him in the office a year and a half ago he continues to do well.  He is active and asymptomatic.    Current Meds  Medication Sig   aspirin  EC 81 MG tablet Take 81 mg by mouth daily.   atorvastatin  (LIPITOR ) 80 MG tablet TAKE 1 TABLET BY MOUTH ONCE DAILY AT 6PM   colchicine  0.6 MG tablet Take 1 tablet (0.6 mg total) by mouth daily.   FEROSUL 325 (65 Fe) MG tablet Take 1 tablet by mouth once daily with breakfast   indomethacin  (INDOCIN  SR) 75 MG CR capsule Take 1 capsule (75 mg total) by mouth daily with breakfast.   lisinopril -hydrochlorothiazide  (ZESTORETIC ) 20-25 MG tablet Take 1 tablet by mouth daily.   metoprolol  tartrate (LOPRESSOR ) 25 MG tablet Take 0.5 tablets (12.5 mg total) by mouth daily.   Multiple Vitamins-Minerals (CENTRUM ADULTS PO) Take 1 tablet by mouth daily.   nitroGLYCERIN  (NITROSTAT ) 0.4 MG SL tablet Place 1 tablet (0.4 mg total) under the tongue every 5 (five) minutes as needed for chest pain.   Omega-3 1000 MG CAPS Take by mouth.     Allergies  Allergen Reactions   Shellfish Allergy Swelling    Causes Gout flareups Causes Gout flareups   Sulfa Antibiotics Swelling    Causes Gout flareups    Social History   Socioeconomic History   Marital status: Unknown    Spouse name: Not on file    Number of children: Not on file   Years of education: Not on file   Highest education level: Not on file  Occupational History   Not on file  Tobacco Use   Smoking status: Never   Smokeless tobacco: Current    Types: Chew  Vaping Use   Vaping status: Never Used  Substance and Sexual Activity   Alcohol use: No   Drug use: No   Sexual activity: Not Currently  Other Topics Concern   Not on file  Social History Narrative   Not on file   Social Drivers of Health   Financial Resource Strain: Low Risk  (09/27/2023)   Overall Financial Resource Strain (CARDIA)    Difficulty of Paying Living Expenses: Not very hard  Food Insecurity: No Food Insecurity (09/27/2023)   Hunger Vital Sign    Worried About Running Out of Food in the Last Year: Never true    Ran Out of Food in the Last Year: Never true  Transportation Needs: No Transportation Needs (09/27/2023)   PRAPARE - Administrator, Civil Service (Medical): No    Lack of Transportation (Non-Medical): No  Physical Activity: Insufficiently Active (09/27/2023)   Exercise Vital Sign    Days of Exercise per Week: 2 days    Minutes of Exercise per Session: 20  min  Stress: No Stress Concern Present (09/27/2023)   [name]    Feeling of Stress : Not at all  Social Connections: Socially Integrated (09/27/2023)   Social Connection and Isolation Panel    Frequency of Communication with Friends and Family: Twice a week    Frequency of Social Gatherings with Friends and Family: Twice a week    Attends Religious Services: 1 to 4 times per year    Active Member of [employer] or Organizations: Yes    Attends Banker Meetings: Never    Marital Status: Living with partner  Intimate Partner Violence: Not At Risk (09/27/2023)   Humiliation, Afraid, Rape, and Kick questionnaire    Fear of Current or Ex-Partner: No    Emotionally Abused: No    Physically  Abused: No    Sexually Abused: No     Review of Systems: General: negative for chills, fever, night sweats or weight changes.  Cardiovascular: negative for chest pain, dyspnea on exertion, edema, orthopnea, palpitations, paroxysmal nocturnal dyspnea or shortness of breath Dermatological: negative for rash Respiratory: negative for cough or wheezing Urologic: negative for hematuria Abdominal: negative for nausea, vomiting, diarrhea, bright red blood per rectum, melena, or hematemesis Neurologic: negative for visual changes, syncope, or dizziness All other systems reviewed and are otherwise negative except as noted above.    Blood pressure 130/66, pulse (!) 48, height 5' 6 (1.676 m), weight 188 lb (85.3 kg), SpO2 97%.  General appearance: alert and no distress Neck: no adenopathy, no carotid bruit, no JVD, supple, symmetrical, trachea midline, and thyroid not enlarged, symmetric, no tenderness/mass/nodules Lungs: clear to auscultation bilaterally Heart: regular rate and rhythm, S1, S2 normal, no murmur, click, rub or gallop Extremities: extremities normal, atraumatic, no cyanosis or edema Pulses: 2+ and symmetric Skin: Skin color, texture, turgor normal. No rashes or lesions Neurologic: Grossly normal  EKG EKG Interpretation Date/Time:  Monday August 25 2024 09:00:41 EDT Ventricular Rate:  47 PR Interval:  196 QRS Duration:  94 QT Interval:  434 QTC Calculation: 384 R Axis:   7  Text Interpretation: Sinus bradycardia When compared with ECG of 14-Oct-2019 06:52, QRS axis Shifted left Criteria for Lateral infarct are no longer Present Confirmed by Court Carrier [phone]) on 08/25/2024 9:12:34 AM    ASSESSMENT AND PLAN:   Dyslipidemia, goal LDL below 70 History of hyperlipidemia on high-dose atorvastatin  with lipid profile performed 09/27/2023 revealing total cholesterol 147, LDL 78 and HDL of 34.  Essential hypertension, benign History of essential hypertension blood  pressure measured today at 130/66.  He is on lisinopril , hydrochlorothiazide  and metoprolol .  History of ST elevation myocardial infarction (STEMI) History of CAD status post STEMI 10/13/2019 Court underwent PCI and drug-eluting stenting of an occluded obtuse marginal branch by Dr. Mady via the radial approach.  He is on aspirin .  He denies chest pain.     Carrier DOROTHA Court MD FACP,FACC,FAHA, [facility] 08/25/2024 9:18 AM

## 2024-08-25 NOTE — Assessment & Plan Note (Signed)
 History of hyperlipidemia on high-dose atorvastatin  with lipid profile performed 09/27/2023 revealing total cholesterol 147, LDL 78 and HDL of 34.

## 2024-08-25 NOTE — Patient Instructions (Signed)

## 2024-09-21 ENCOUNTER — Other Ambulatory Visit: Payer: Self-pay | Admitting: Family Medicine

## 2024-10-20 ENCOUNTER — Other Ambulatory Visit: Payer: Self-pay | Admitting: Family Medicine

## 2024-10-20 MED ORDER — PREDNISONE 10 MG (21) PO TBPK: ORAL_TABLET | ORAL | 0 refills | Status: DC

## 2024-11-10 ENCOUNTER — Encounter: Payer: Self-pay | Admitting: Family Medicine

## 2024-11-10 ENCOUNTER — Ambulatory Visit: Admitting: Family Medicine

## 2024-11-10 VITALS — BP 144/80 | HR 67 | Ht 66.0 in | Wt 187.0 lb

## 2024-11-10 DIAGNOSIS — M25512 Pain in left shoulder: Secondary | ICD-10-CM

## 2024-11-10 DIAGNOSIS — R8290 Unspecified abnormal findings in urine: Secondary | ICD-10-CM

## 2024-11-10 DIAGNOSIS — M1A9XX0 Chronic gout, unspecified, without tophus (tophi): Secondary | ICD-10-CM

## 2024-11-10 LAB — POCT URINALYSIS DIP (CLINITEK)
Bilirubin, UA: NEGATIVE
Blood, UA: NEGATIVE
Glucose, UA: NEGATIVE mg/dL
Ketones, POC UA: NEGATIVE mg/dL
Nitrite, UA: NEGATIVE
POC PROTEIN,UA: NEGATIVE
Spec Grav, UA: 1.02 (ref 1.010–1.025)
Urobilinogen, UA: 0.2 U/dL
pH, UA: 6 (ref 5.0–8.0)

## 2024-11-10 MED ORDER — ALLOPURINOL 100 MG PO TABS: 50.0000 mg | ORAL_TABLET | Freq: Every day | ORAL | 0 refills | Status: AC

## 2024-11-10 MED ORDER — PREDNISONE 10 MG (21) PO TBPK: ORAL_TABLET | ORAL | 0 refills | Status: AC

## 2024-11-10 MED ORDER — NITROFURANTOIN MONOHYD MACRO 100 MG PO CAPS: 100.0000 mg | ORAL_CAPSULE | Freq: Two times a day (BID) | ORAL | 0 refills | Status: AC

## 2024-11-11 ENCOUNTER — Ambulatory Visit: Payer: Self-pay | Admitting: Family Medicine

## 2024-11-12 ENCOUNTER — Encounter: Payer: Self-pay | Admitting: Family Medicine
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
## 2024-12-19 ENCOUNTER — Other Ambulatory Visit: Payer: Self-pay | Admitting: Family Medicine
